# Patient Record
Sex: Female | Born: 1985 | Race: White | Hispanic: No | Marital: Married | Smoking: Never smoker
Health system: Southern US, Community
[De-identification: ages and names within clinical notes are randomized; demographics above are authoritative.]

## PROBLEM LIST (undated history)

## (undated) ENCOUNTER — Inpatient Hospital Stay: Payer: Self-pay

## (undated) DIAGNOSIS — F419 Anxiety disorder, unspecified: Secondary | ICD-10-CM

## (undated) DIAGNOSIS — K219 Gastro-esophageal reflux disease without esophagitis: Secondary | ICD-10-CM

## (undated) DIAGNOSIS — D649 Anemia, unspecified: Secondary | ICD-10-CM

## (undated) HISTORY — DX: Anxiety disorder, unspecified: F41.9

## (undated) HISTORY — DX: Anemia, unspecified: D64.9

## (undated) HISTORY — PX: WISDOM TOOTH EXTRACTION: SHX21

---
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
## 2011-05-23 ENCOUNTER — Encounter: Payer: Self-pay | Admitting: Maternal & Fetal Medicine

## 2011-06-06 ENCOUNTER — Encounter: Payer: Self-pay | Admitting: Obstetrics and Gynecology

## 2011-07-04 ENCOUNTER — Encounter: Payer: Self-pay | Admitting: Obstetrics and Gynecology

## 2011-08-16 ENCOUNTER — Encounter: Payer: Self-pay | Admitting: Pediatric Cardiology

## 2011-12-08 ENCOUNTER — Inpatient Hospital Stay: Payer: Self-pay | Admitting: Obstetrics and Gynecology

## 2011-12-08 LAB — CBC WITH DIFFERENTIAL/PLATELET
Basophil #: 0.1 10*3/uL (ref 0.0–0.1)
Basophil %: 0.5 %
Eosinophil #: 0.1 10*3/uL (ref 0.0–0.7)
HGB: 12.1 g/dL (ref 12.0–16.0)
Lymphocyte #: 2 10*3/uL (ref 1.0–3.6)
Lymphocyte %: 19.1 %
MCH: 30.4 pg (ref 26.0–34.0)
MCHC: 35 g/dL (ref 32.0–36.0)
MCV: 87 fL (ref 80–100)
Monocyte #: 0.6 x10 3/mm (ref 0.2–0.9)
Monocyte %: 6.1 %
Neutrophil #: 7.6 10*3/uL — ABNORMAL HIGH (ref 1.4–6.5)
RBC: 4 10*6/uL (ref 3.80–5.20)

## 2011-12-09 LAB — PLATELET COUNT: Platelet: 139 10*3/uL — ABNORMAL LOW (ref 150–440)

## 2011-12-10 LAB — HEMATOCRIT: HCT: 33 % — ABNORMAL LOW (ref 35.0–47.0)

## 2013-04-19 IMAGING — US US OB NUCHAL TRANSLUCENCY 1ST GEST - [facility] NRPT
1 series · 14 of 28 positions shown · non-contrast
Comparison: none

[Series 1: us ob nuchal translucency 1st gest - [facility] nrpt · 14 of 30 slices shown]
[im 2/30]
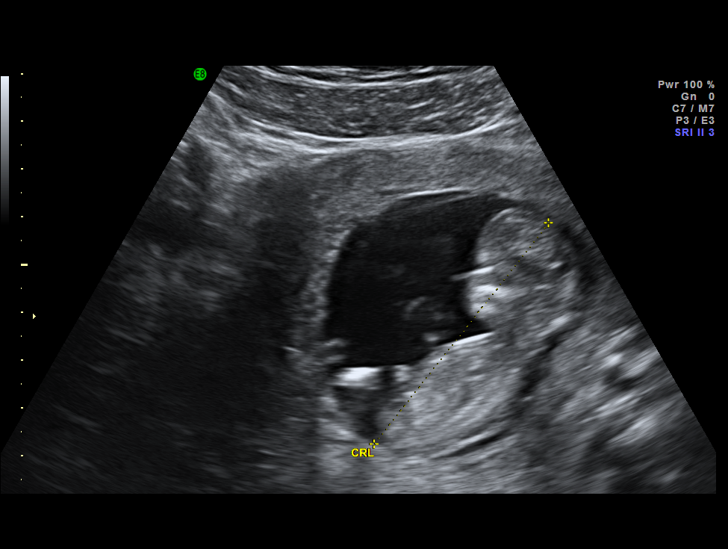
[im 4/30]
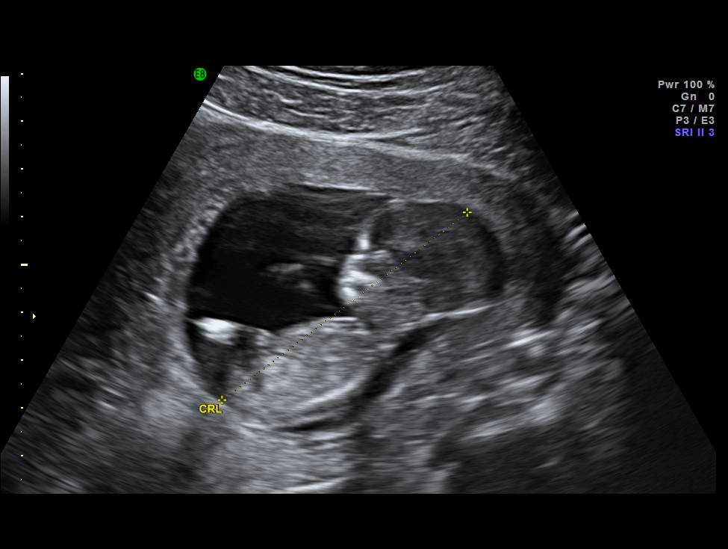
[im 6/30]
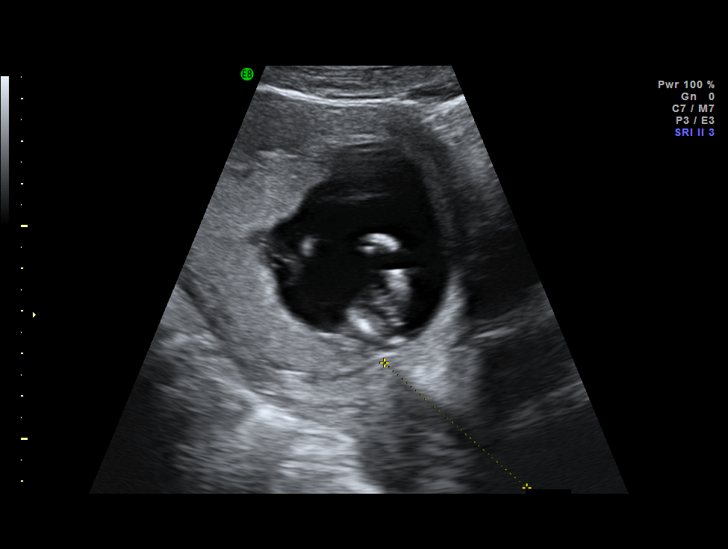
[im 8/30]
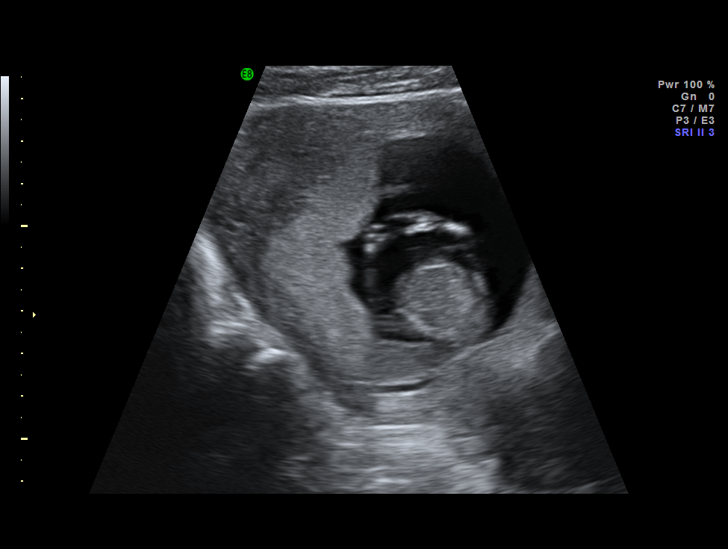
[im 10/30]
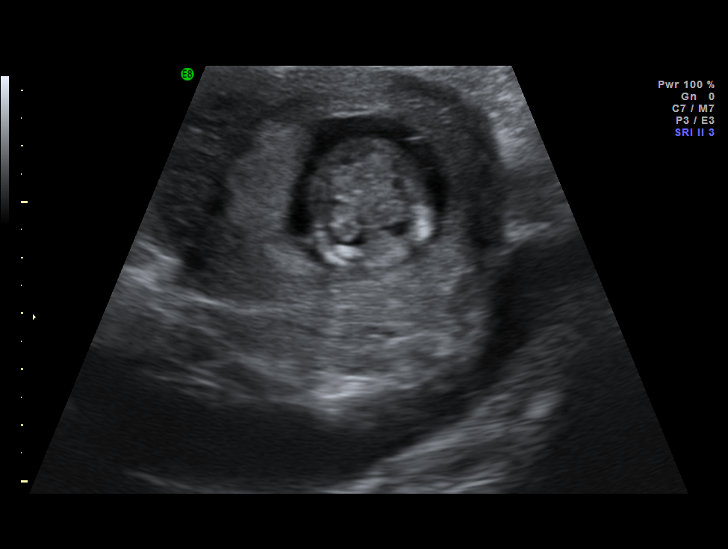
[im 12/30]
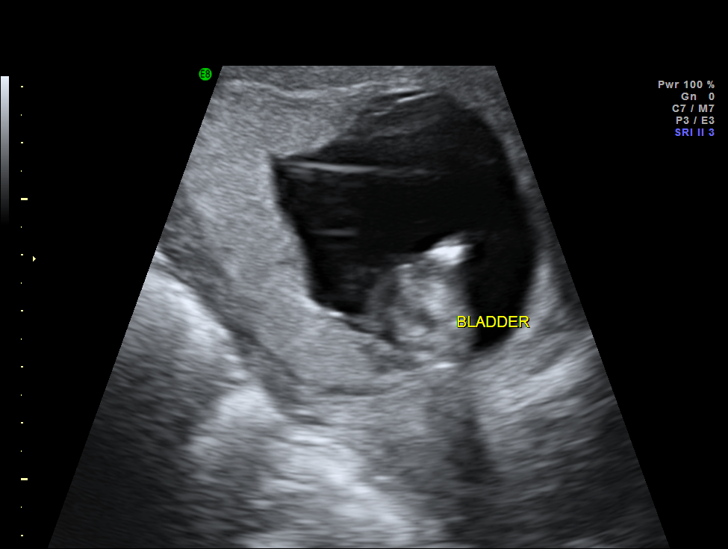
[im 14/30]
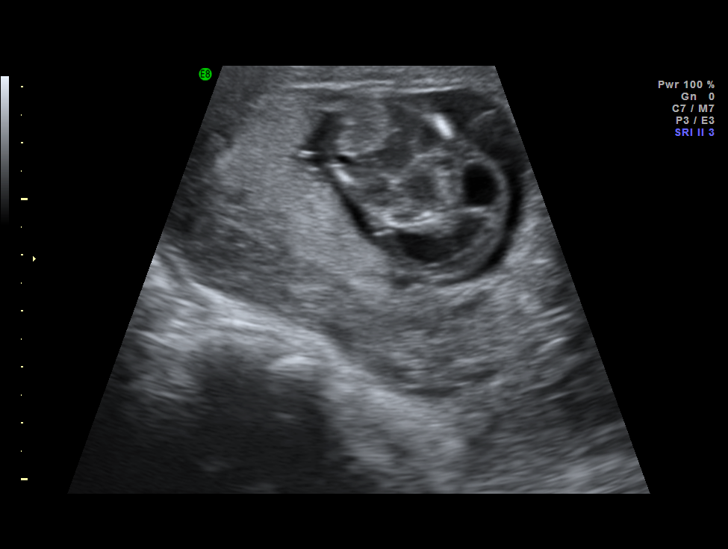
[im 17/30]
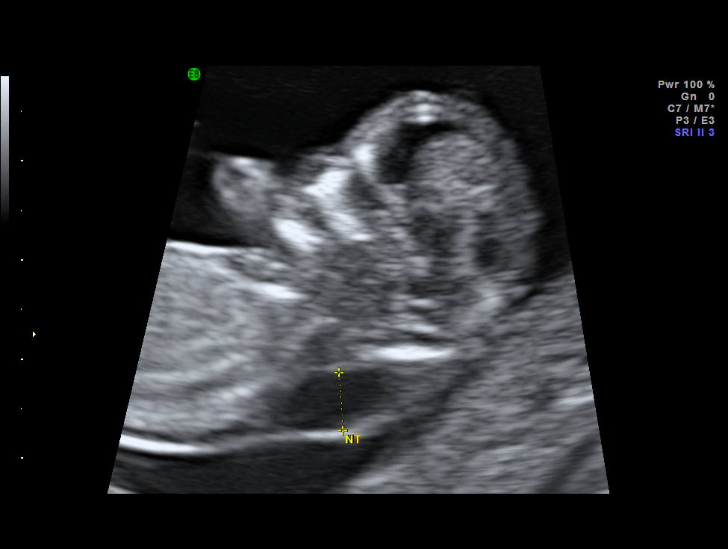
[im 19/30]
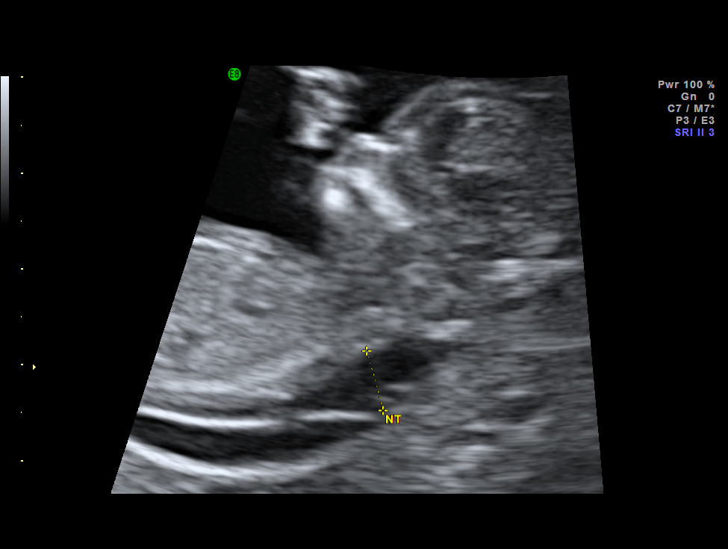
[im 21/30]
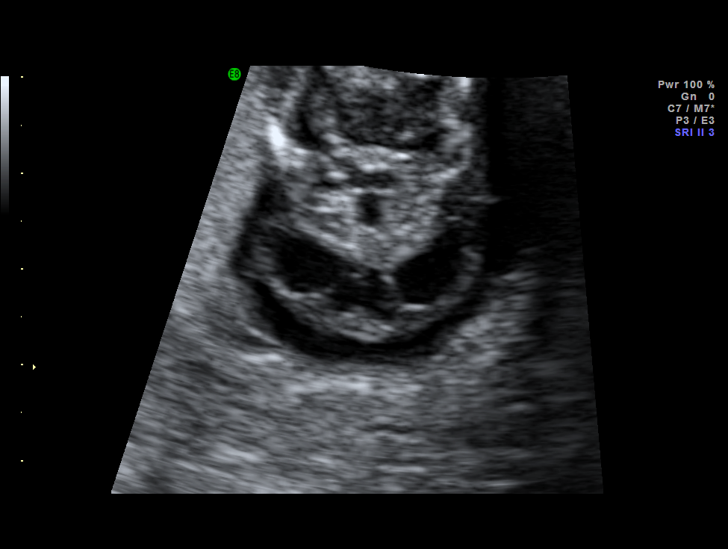
[im 23/30]
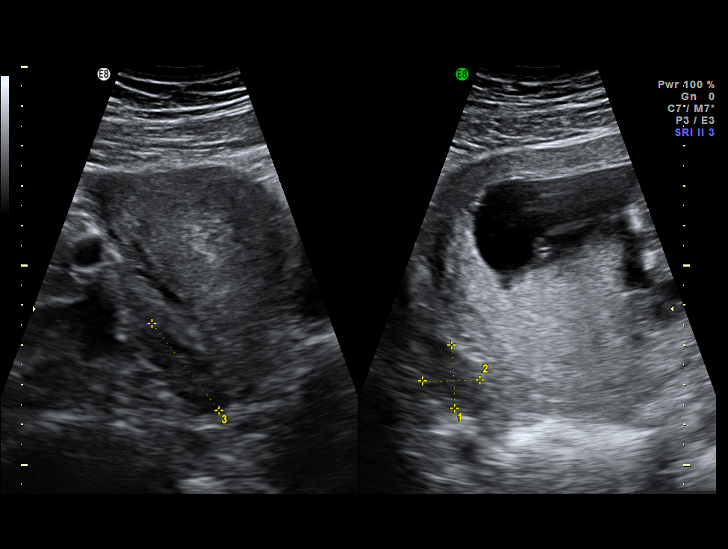
[im 25/30]
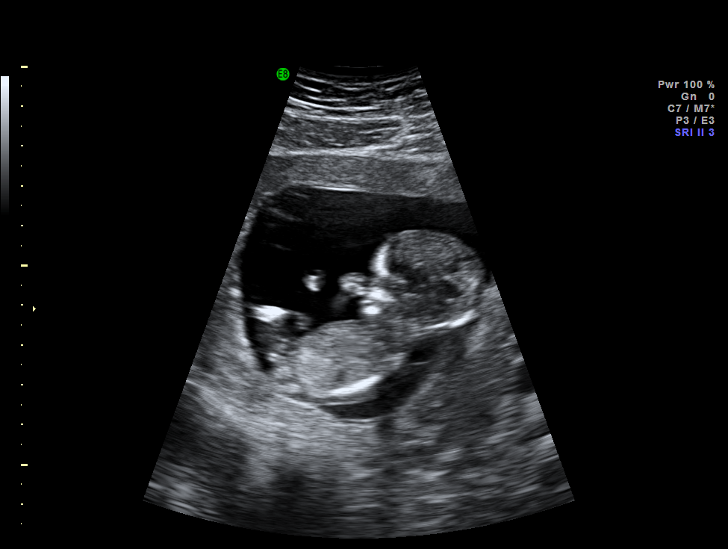
[im 27/30]
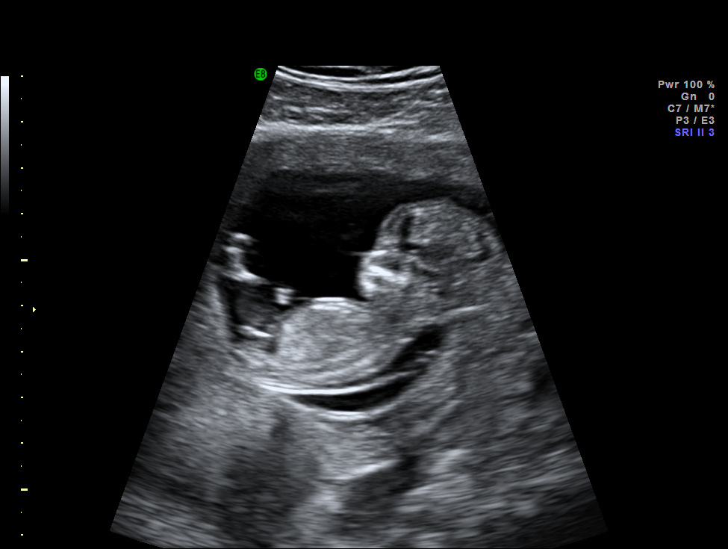
[im 30/30]
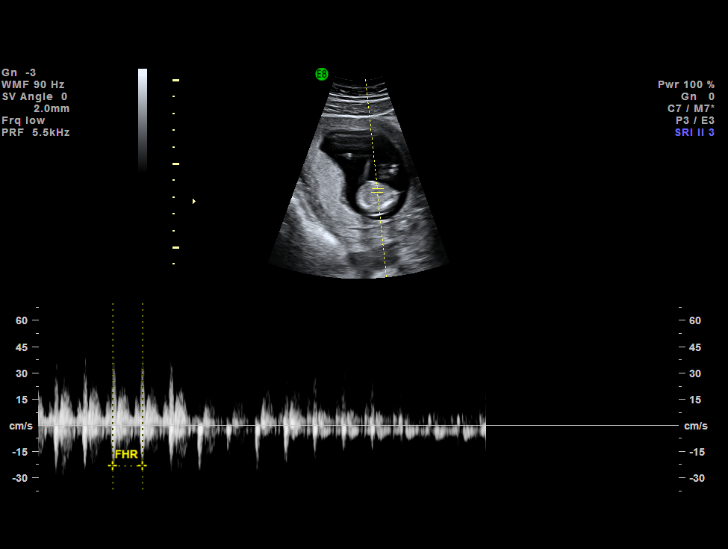

[14 of 28 positions shown; findings below may reference images not displayed]

IMAGES IMPORTED FROM THE SYNGO WORKFLOW SYSTEM
NO DICTATION FOR STUDY

## 2013-05-03 IMAGING — US ULTRAOUND OB LIMITED - NRPT [facility]
1 series · 14 of 28 positions shown · non-contrast
Comparison: none

[Series 1: ultraound ob limited - nrpt [facility] · 14 of 32 slices shown]
[im 2/32]
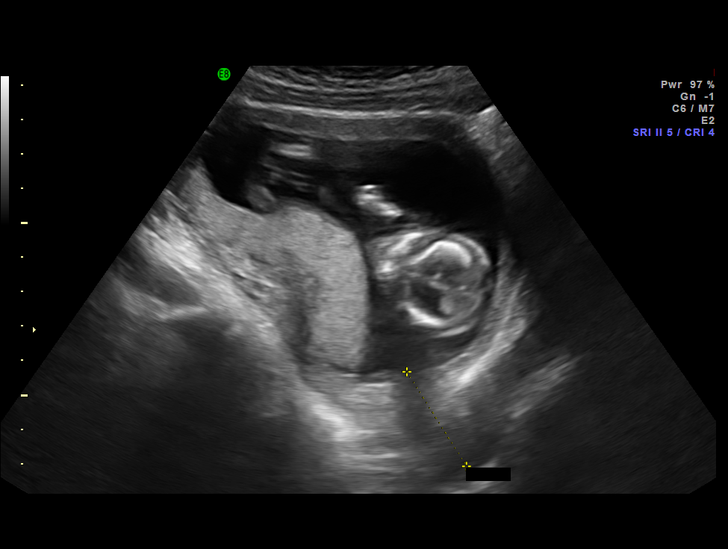
[im 4/32]
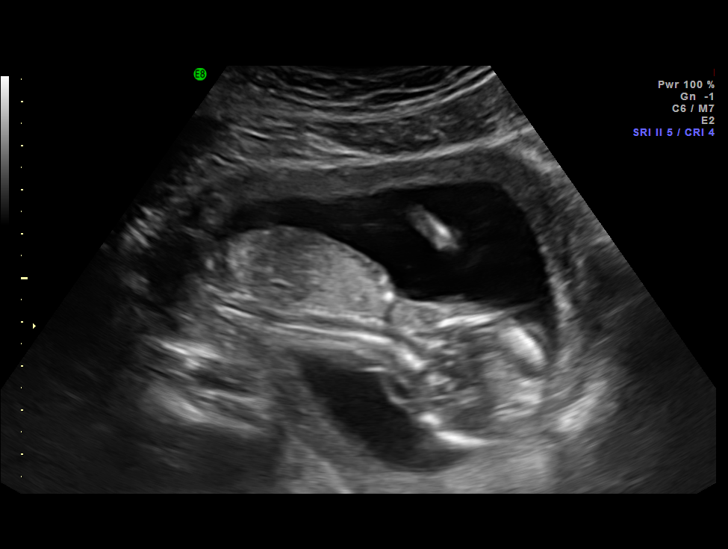
[im 6/32]
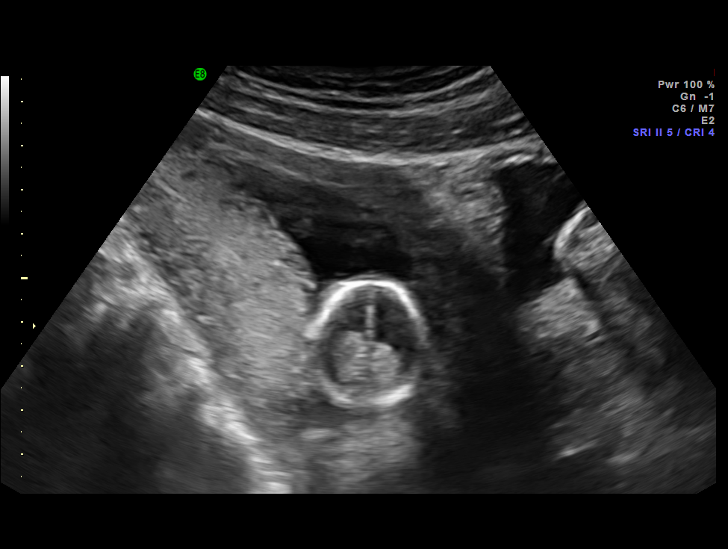
[im 9/32]
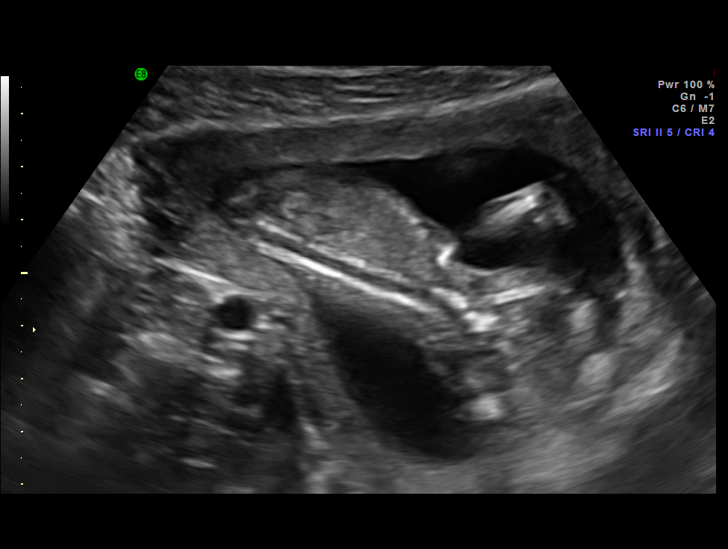
[im 11/32]
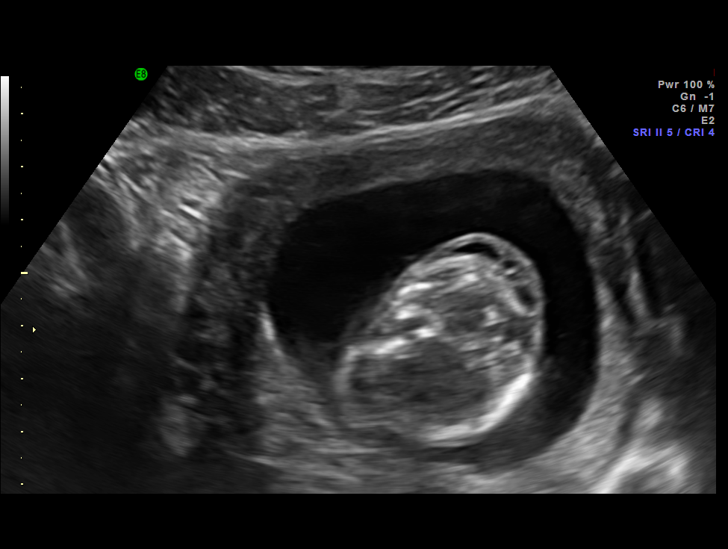
[im 13/32]
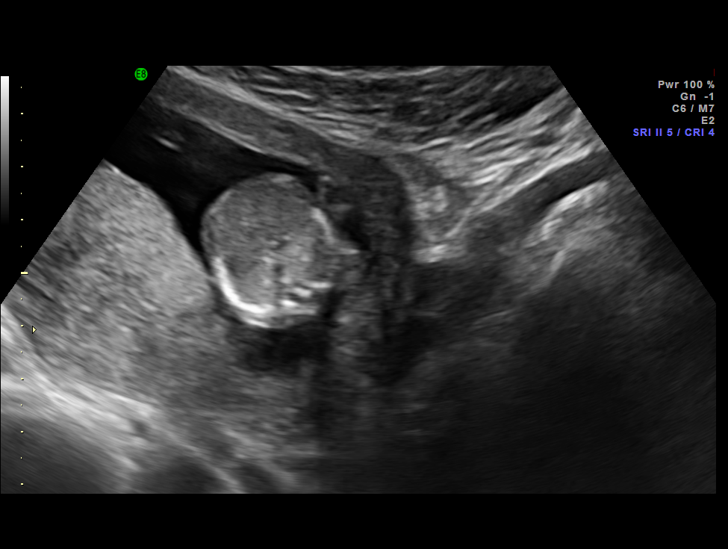
[im 15/32]
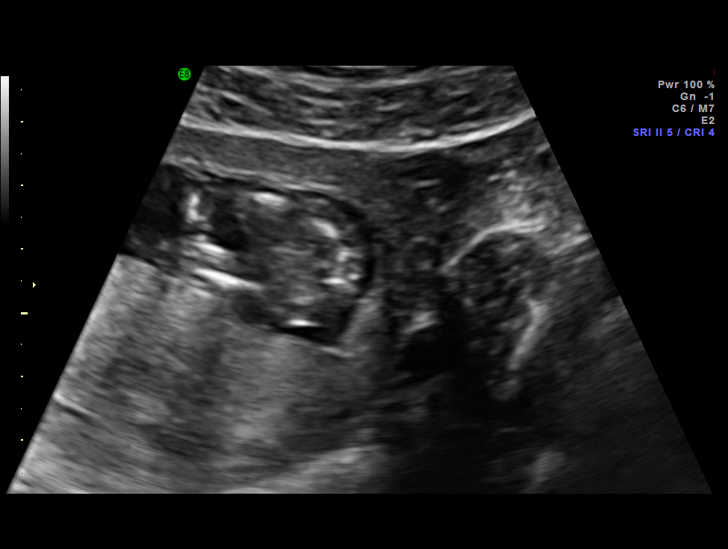
[im 18/32]
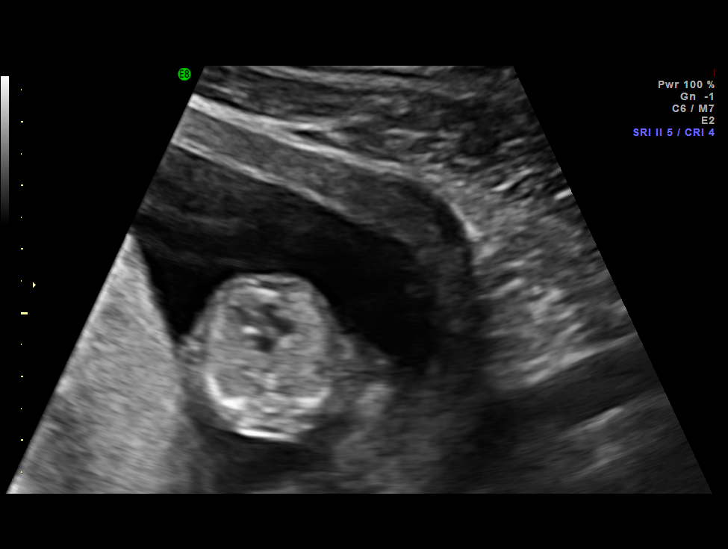
[im 20/32]
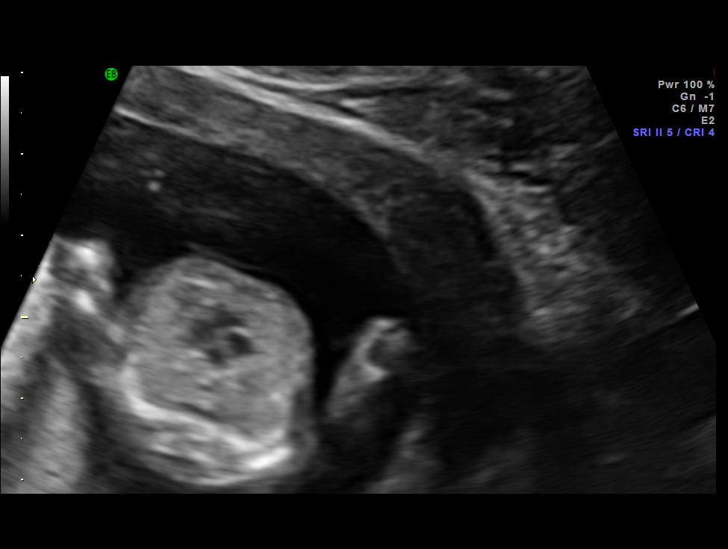
[im 22/32]
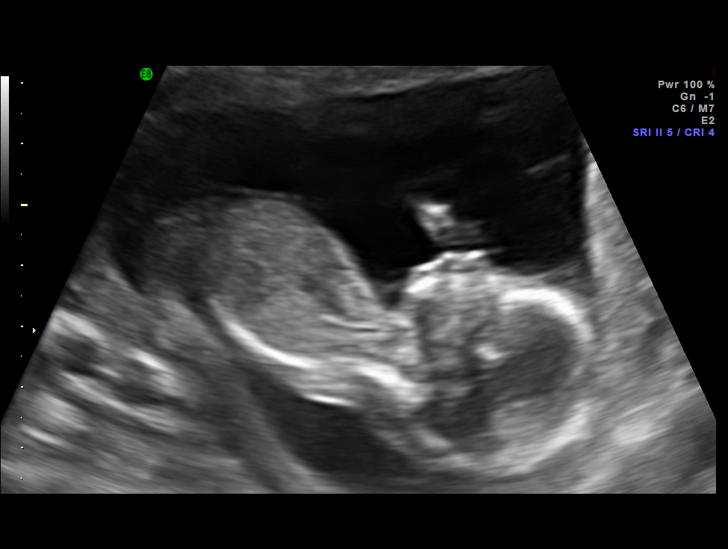
[im 25/32]
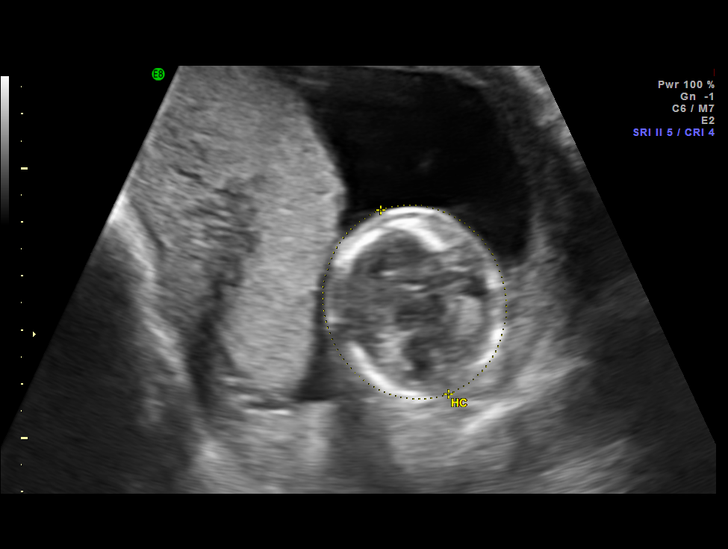
[im 27/32]
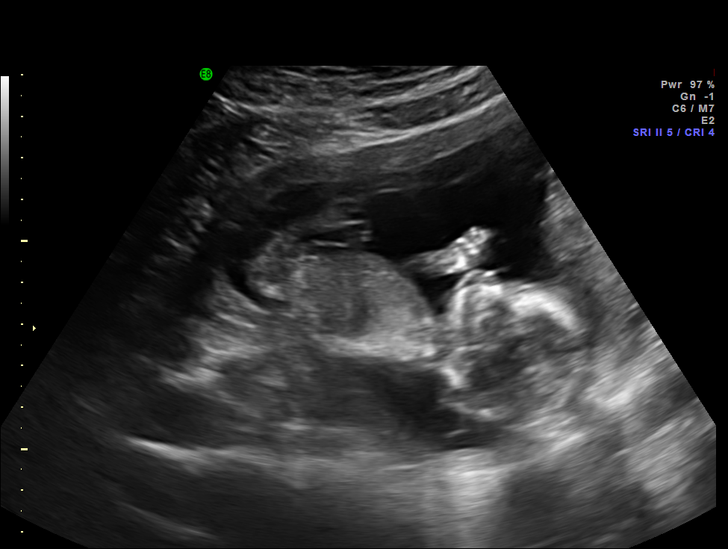
[im 29/32]
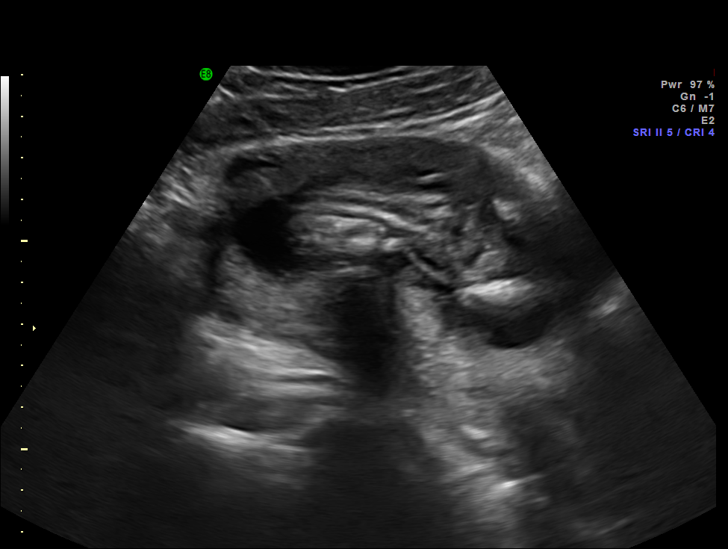
[im 32/32]
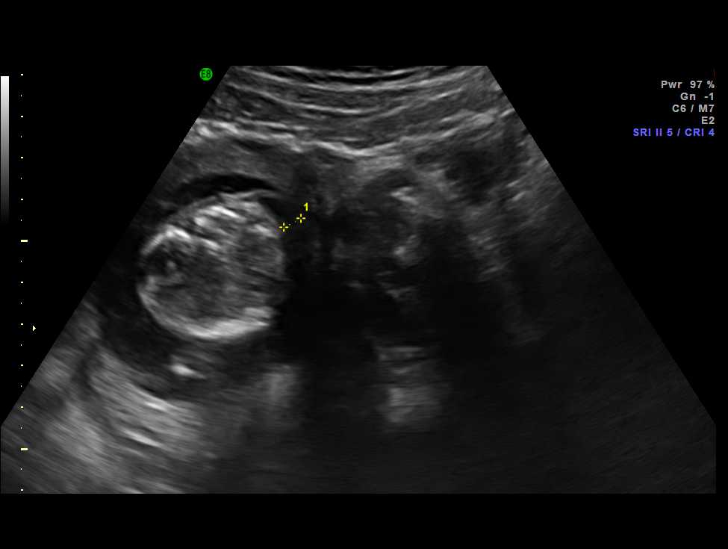

[14 of 28 positions shown; findings below may reference images not displayed]

IMAGES IMPORTED FROM THE SYNGO WORKFLOW SYSTEM
NO DICTATION FOR STUDY

## 2013-05-31 IMAGING — US US OB DETAIL+14 WK - NRPT [facility]
1 series · 14 of 28 positions shown · non-contrast
Comparison: none

[Series 1: us ob detail+14 wk - nrpt [facility] · 14 of 79 slices shown]
[im 3/79]
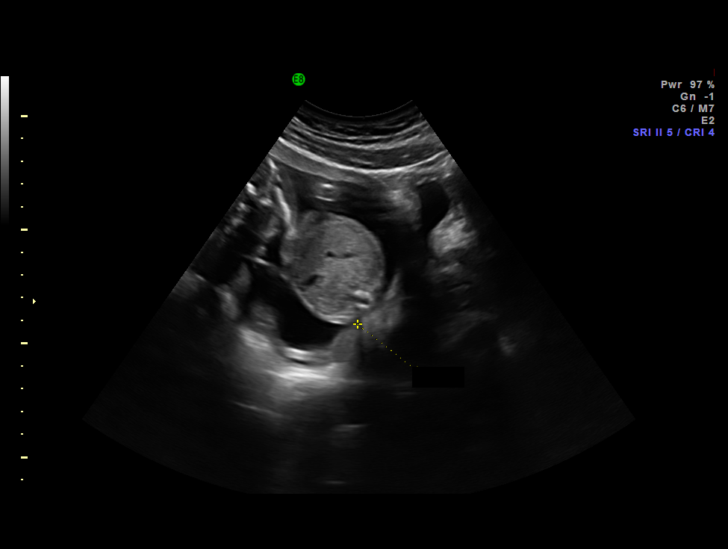
[im 9/79]
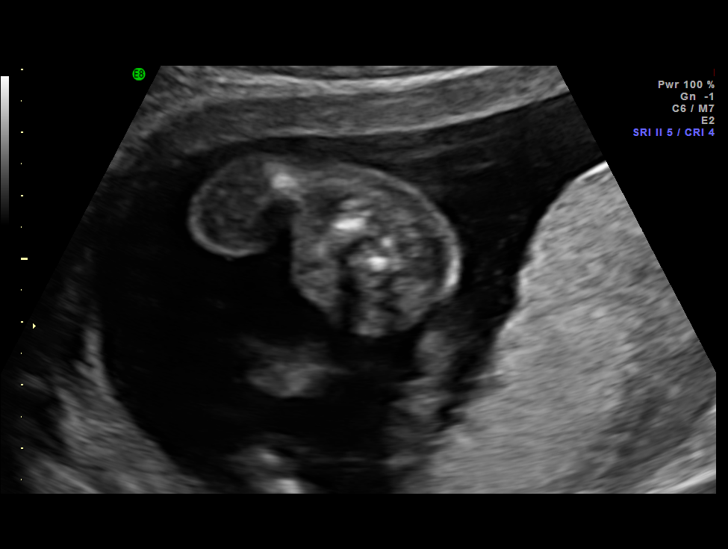
[im 15/79]
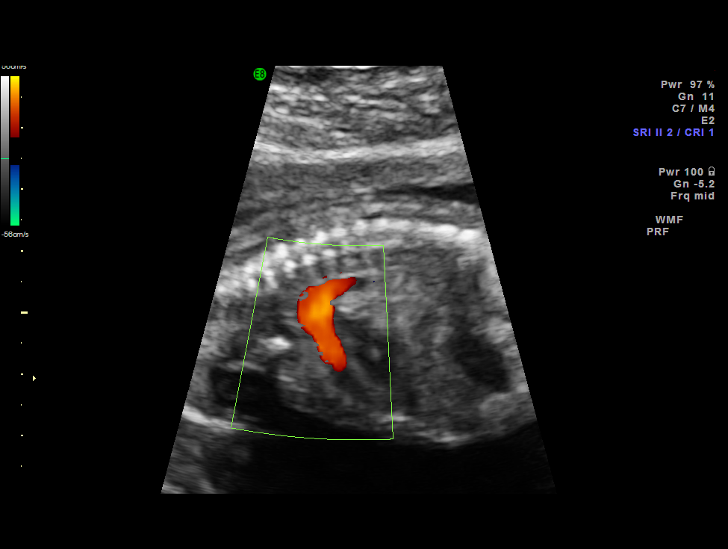
[im 21/79]
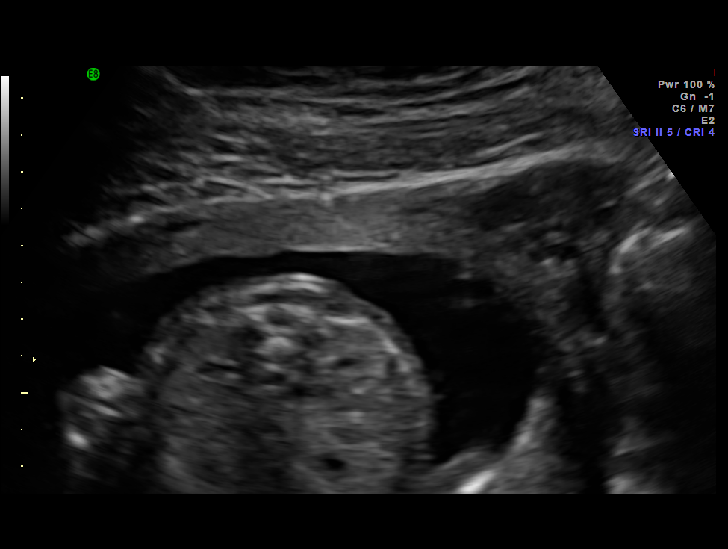
[im 27/79]
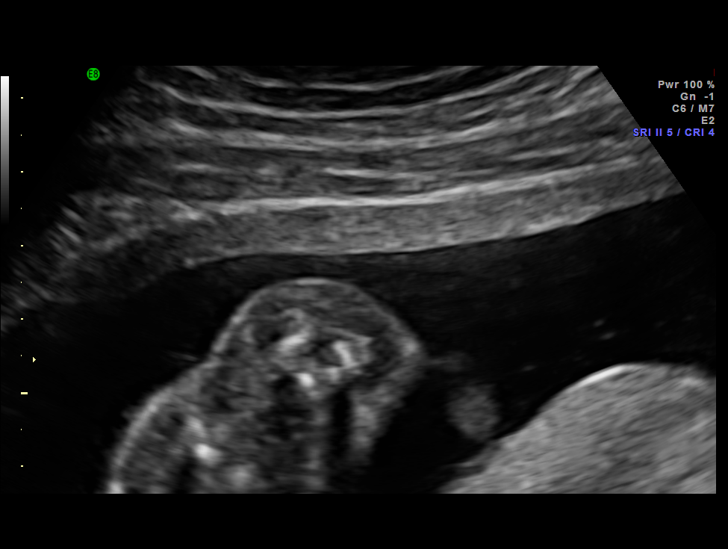
[im 32/79]
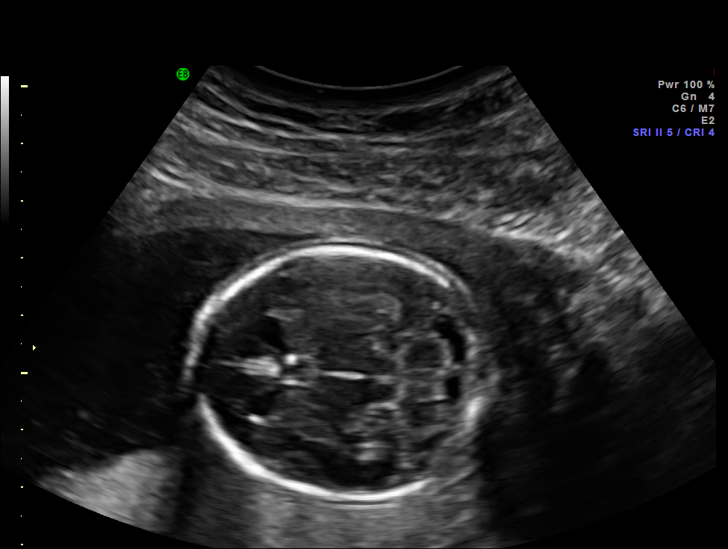
[im 38/79]
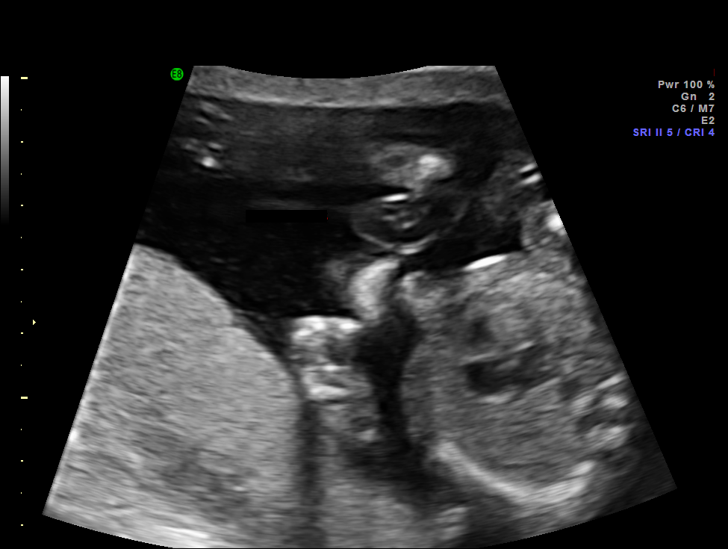
[im 44/79]
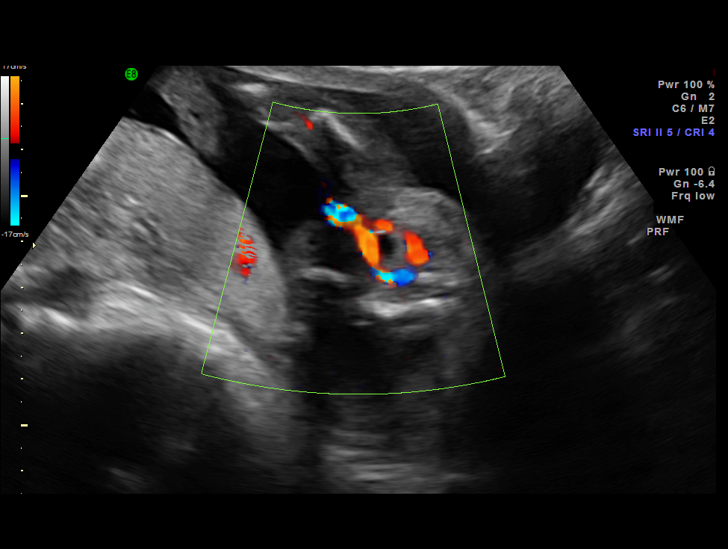
[im 50/79]
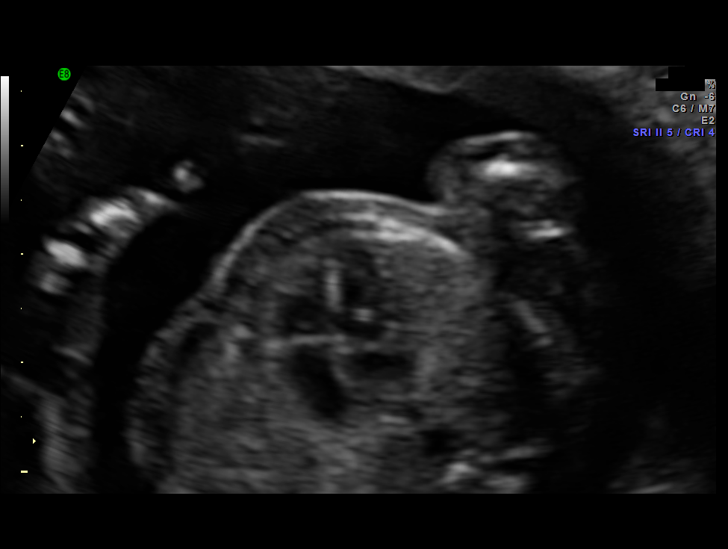
[im 55/79]
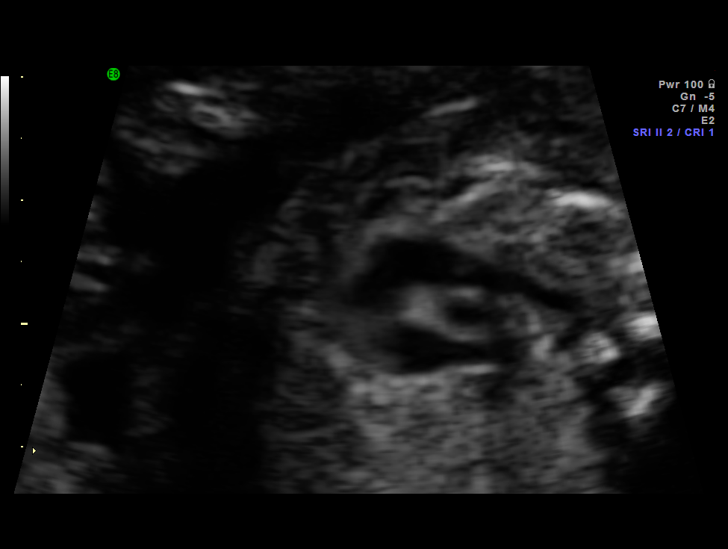
[im 61/79]
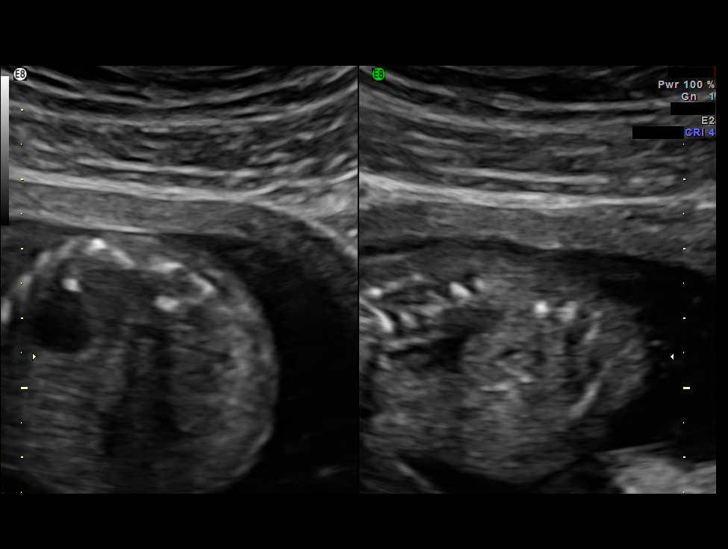
[im 67/79]
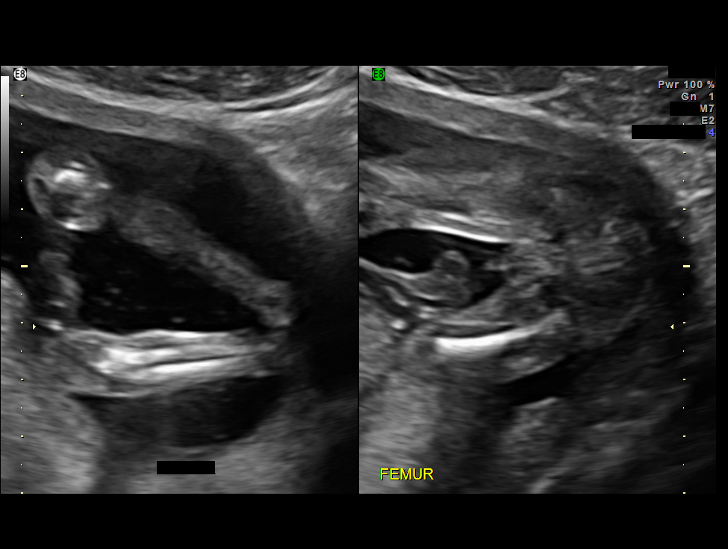
[im 73/79]
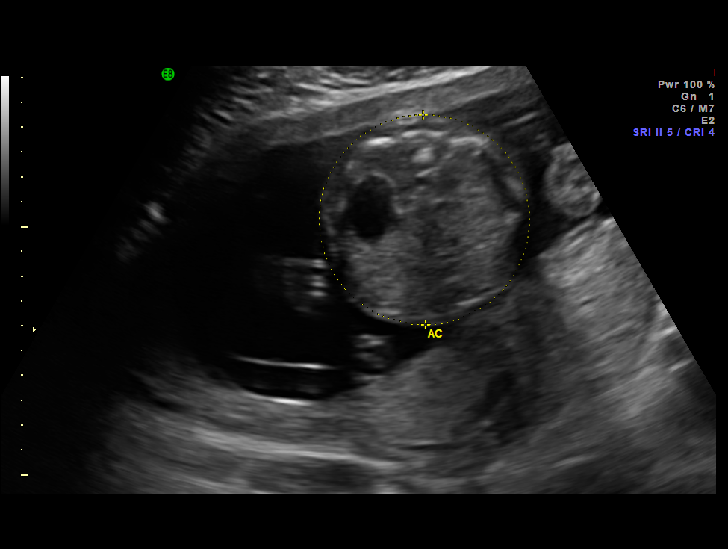
[im 79/79]
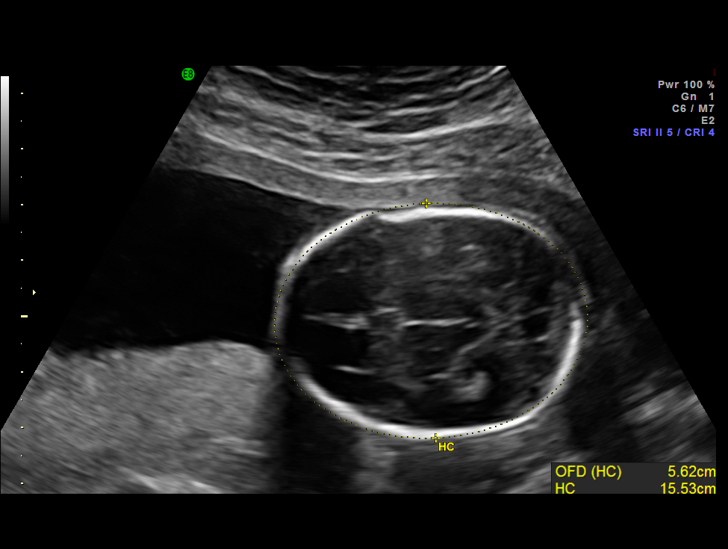

[14 of 28 positions shown; findings below may reference images not displayed]

IMAGES IMPORTED FROM THE SYNGO WORKFLOW SYSTEM
NO DICTATION FOR STUDY

## 2013-09-27 ENCOUNTER — Encounter: Payer: Self-pay | Admitting: Internal Medicine

## 2014-03-14 DIAGNOSIS — T8859XA Other complications of anesthesia, initial encounter: Secondary | ICD-10-CM

## 2014-03-14 HISTORY — DX: Other complications of anesthesia, initial encounter: T88.59XA

## 2014-07-01 NOTE — Op Note (Signed)
PATIENT NAME:  Heidi Bonilla, [name] MR#:  [id number] DATE OF BIRTH:  [birth year]  DATE OF PROCEDURE:  12/09/2011  PREOPERATIVE DIAGNOSIS:  1. Nonreassuring fetal monitoring.  2. Postdate gestation.   POSTOPERATIVE DIAGNOSIS:  1. Nonreassuring fetal monitoring. 2. Postdate gestation.  PROCEDURE:  1. Primary low transverse cesarean section.  2. On-Q pump placement.   ANESTHESIA: Surgical dosing of continuous lumbar epidural.   SURGEON: Suzy Bouchardhomas J Schermerhorn, MD   FIRST ASSISTANT: Street   INDICATIONS: This is a 29 year old gravida 1, para 0 patient brought in the night before for induction for postdates. The fetus developed intermittent bradycardic episodes when the patient was only 4 cm dilated. In utero resuscitation demonstrated good fetal monitoring and then  repeat decelerations occurred.   DESCRIPTION OF PROCEDURE: After surgical dosing of continuous lumbar epidural, the patient was placed in the dorsal supine position with a hip roll under the right side. The patient's abdomen was prepped and draped in normal sterile fashion. A Foley catheter was placed. A low transverse uterine incision was made. The fascia was scored in the midline, and the fascia was opened in a transverse fashion. The superior aspect of the fascia was grasped with Kocher clamps and the recti muscles dissected free. The inferior aspect of the fascia was grasped with Kocher clamps. The pyramidalis muscle was dissected free. Entry into the peritoneal cavity was accomplished sharply. The vesicouterine peritoneal fold was identified and opened and the bladder was reflected inferiorly. A low transverse uterine incision was made. Upon entry into the endometrial cavity, clear fluid resulted. The incision was extended with blunt transverse traction. The fetal head was brought to the incision and a vacuum applied to the occiput. With one gentle pull the fetal head was delivered. There was some caput and molding before vacuum was  placed. The patient's pelvis seemed slightly restricted, and the fetal head was wedged into the pelvis. The shoulders and body were delivered without difficulty. A vigorous female was passed to Dr. Beckie Saltsasnadi, who assigned Apgar scores of eight and nine. The placenta was manually delivered and the uterus was exteriorized. The endometrial cavity was wiped clean with a laparotomy tape. The uterine incision was closed with 1 chromic suture in a running locking fashion. Good hemostasis was noted. The fallopian tubes and ovaries appeared normal. The posterior cul-de-sac was irrigated and suctioned. The uterus was placed back into the abdominal cavity. The paracolic gutters were wiped clean with laparotomy tape, and the uterine incision again appeared hemostatic. Interceed was placed over the uterine incision in a T-shaped fashion. The superior aspect of the fascia was then grasped with Kocher clamps, and the On-Q pump catheters were tunneled from the infraumbilical area subfascially. The fascia was then closed over top of this with 0 Vicryl suture with good approximation of edges. Good hemostasis was noted. Subcutaneous tissues were irrigated and bovied, and the skin was reapproximated with Insorb absorbable stapler, and the On-Q pump catheters were secured at the skin level with Dermabond and Steri-Strips and a Tegaderm placed over this. Each catheter was loaded with 5 mL of 0.5% Marcaine. There were no complications. Estimated blood loss 500 mL. Intraoperative fluids were 1300 mL. The patient tolerated the procedure well and was taken to the recovery room in good condition.   ____________________________ Suzy Bouchardhomas J. Schermerhorn, MD tjs:cbb D: 12/09/2011 17:59:21 ET T: 12/10/2011 09:52:36 ET JOB#: [id number]  cc: Suzy Bouchardhomas J. Schermerhorn, MD, <Dictator> Suzy BouchardHOMAS J SCHERMERHORN MD ELECTRONICALLY SIGNED 12/12/2011 9:42

## 2014-07-01 NOTE — Discharge Summary (Signed)
PATIENT NAME:  [name], Heidi D MR#:  [id number] DATE OF BIRTH:  [birth year]  DATE OF ADMISSION:  12/08/2011 DATE OF DISCHARGE:  12/12/2011  HOSPITAL COURSE: The patient underwent post dates induction with which the patient had labor that progressed to 4 cm. The fetus had intolerance to labor and she underwent a primary cesarean section, uncomplicated. Postoperative day #1 hematocrit 33%. The patient did well postoperatively. She is discharged to home in good condition.   DISCHARGE MEDICATIONS:  1. Norco 1 to 2 tablets every 4 to 6 hours.  2. Motrin 800 mg every eight hours.  3. Micronor 1 tablet daily starting in three weeks. The patient is aware of taking this at the same time every day.  DISCHARGE INSTRUCTIONS AND FOLLOWUP: The patient will have pelvic rest for six weeks.  I will see her back in the office in two weeks or before if she has wound drainage, fever, nausea, or vomiting.    ____________________________ Suzy Bouchardhomas J. Boluwatife Mutchler, MD tjs:bjt D: 12/12/2011 09:28:14 ET T: 12/12/2011 10:30:33 ET JOB#: [id number]  cc: Suzy Bouchardhomas J. Koraima Albertsen, MD, <Dictator> Suzy BouchardHOMAS J Flynt Breeze MD ELECTRONICALLY SIGNED 12/13/2011 12:11

## 2014-11-24 ENCOUNTER — Observation Stay
Admission: RE | Admit: 2014-11-24 | Discharge: 2014-11-24 | Disposition: A | Discharge status: Home / Self Care | Payer: BC Managed Care – PPO | Attending: Obstetrics and Gynecology | Admitting: Obstetrics and Gynecology

## 2014-11-24 DIAGNOSIS — R102 Pelvic and perineal pain: Secondary | ICD-10-CM | POA: Insufficient documentation

## 2014-11-24 DIAGNOSIS — O26899 Other specified pregnancy related conditions, unspecified trimester: Secondary | ICD-10-CM | POA: Diagnosis not present

## 2014-11-24 LAB — URINALYSIS COMPLETE WITH MICROSCOPIC (ARMC ONLY)
BACTERIA UA: NONE SEEN
Bilirubin Urine: NEGATIVE
Glucose, UA: NEGATIVE mg/dL
HGB URINE DIPSTICK: NEGATIVE
Ketones, ur: NEGATIVE mg/dL
Nitrite: NEGATIVE
PH: 7 (ref 5.0–8.0)
PROTEIN: NEGATIVE mg/dL
RBC / HPF: NONE SEEN RBC/hpf (ref 0–5)
Specific Gravity, Urine: 1.008 (ref 1.005–1.030)

## 2014-11-24 LAB — FETAL FIBRONECTIN: FETAL FIBRONECTIN: NEGATIVE

## 2014-11-24 MED ORDER — ACETAMINOPHEN 325 MG PO TABS: 650.0000 mg | ORAL_TABLET | ORAL | Status: DC | PRN

## 2014-11-24 MED ORDER — TERBUTALINE SULFATE 1 MG/ML IJ SOLN
0.2500 mg | Freq: Once | INTRAMUSCULAR | Status: AC
  Administered 2014-11-24: 0.25 mg via SUBCUTANEOUS

## 2014-11-24 MED ORDER — TERBUTALINE SULFATE 1 MG/ML IJ SOLN
INTRAMUSCULAR | Status: AC
  Administered 2014-11-24: 0.25 mg via SUBCUTANEOUS
  Filled 2014-11-24: qty 1

## 2014-11-24 NOTE — Discharge Instructions (Signed)
Call provider or return to birthplace with: ° °1. Regular contractions °2. Leaking of fluid from your vaginal °3. Vaginal bleeding: Bright red or heavy like a period °4. Decreased Fetal movement ° °

## 2014-11-24 NOTE — Progress Notes (Signed)
Patient ID: Heidi Bonilla, female   DOB: Apr 04, 1985, 29 y.o.   MRN: [id number] Heidi Bonilla [birth year] G2 P1 Unknown presents for possible LOF and pelvic pressure  No vag bleeding . No prior h/o of PTL with last  O;BP 118/63 mmHg  Pulse 80  Ht  (1.676 m)  Wt 78.472 kg (173 lb)  BMI 27.94 kg/m2  SpO2 98% ABDsoft NT  CX closed by RN , Negative nitrazine . Negative FFN NST reassuring , + uterine irritability  Labs: ua  + leukocytes  A: no evidence of ROM . Uterine irritability with out cx dilation  P:sq terb X 1 Urine culture  Precautions given  RTC if 6 or greater ctx / hr

## 2014-11-26 LAB — URINE CULTURE

## 2014-12-03 DIAGNOSIS — B373 Candidiasis of vulva and vagina: Secondary | ICD-10-CM | POA: Insufficient documentation

## 2014-12-03 DIAGNOSIS — O2213 Genital varices in pregnancy, third trimester: Secondary | ICD-10-CM | POA: Insufficient documentation

## 2014-12-03 DIAGNOSIS — O23593 Infection of other part of genital tract in pregnancy, third trimester: Secondary | ICD-10-CM | POA: Insufficient documentation

## 2014-12-03 DIAGNOSIS — N760 Acute vaginitis: Secondary | ICD-10-CM | POA: Insufficient documentation

## 2014-12-03 DIAGNOSIS — B3731 Acute candidiasis of vulva and vagina: Secondary | ICD-10-CM | POA: Insufficient documentation

## 2015-01-28 NOTE — H&P (Signed)
Heidi Bonilla is a 29 y.o. female presenting for elective repeat c/s . EDC 02/13/15. History OB History    Gravida Para Term Preterm AB TAB SAB Ectopic Multiple Living   2 1        1      No past medical history on file.none  No past surgical history on file.previous c/s 11/2011 Family History: family history is not on file. Social History:  has no tobacco, alcohol, and drug history on file.   Prenatal Transfer Tool  Maternal Diabetes: No Genetic Screening: Declined Maternal Ultrasounds/Referrals: Normal Fetal Ultrasounds or other Referrals:  None Maternal Substance Abuse:  No Significant Maternal Medications:  None Significant Maternal Lab Results:  None Other Comments:  None  ROS    There were no vitals taken for this visit. Exam Physical Exam  Prenatal labs: ABO, Rh:  B+ Antibody:  neg Rubella:  IMMune , Varicella : immune  RPR:   neg  HBsAg:   neg  HIV:   neg GBS:   neg  Assessment/Plan Pt elects for repeat LTCS . Declines BTL  Surgery scheduled on 02/09/16( 39+3 weeks )   Jackey Housey 01/28/2015, 11:59 AM

## 2015-02-06 ENCOUNTER — Encounter
Admission: RE | Admit: 2015-02-06 | Discharge: 2015-02-06 | Disposition: A | Discharge status: Home / Self Care | Payer: BC Managed Care – PPO | Source: Ambulatory Visit | Attending: Obstetrics and Gynecology | Admitting: Obstetrics and Gynecology

## 2015-02-06 LAB — CBC
HEMATOCRIT: 33.5 % — AB (ref 35.0–47.0)
HEMOGLOBIN: 11.6 g/dL — AB (ref 12.0–16.0)
MCH: 29.7 pg (ref 26.0–34.0)
MCHC: 34.6 g/dL (ref 32.0–36.0)
MCV: 85.9 fL (ref 80.0–100.0)
Platelets: 123 10*3/uL — ABNORMAL LOW (ref 150–440)
RBC: 3.89 MIL/uL (ref 3.80–5.20)
RDW: 12.6 % (ref 11.5–14.5)
WBC: 6.1 10*3/uL (ref 3.6–11.0)

## 2015-02-06 LAB — TYPE AND SCREEN
ABO/RH(D): B POS
ANTIBODY SCREEN: NEGATIVE

## 2015-02-06 LAB — ABO/RH: ABO/RH(D): B POS

## 2015-02-06 NOTE — Patient Instructions (Signed)
  Your procedure is scheduled on: 02/09/15 Report to Day Surgery/ Labor and Delivery .  Remember: Instructions that are not followed completely may result in serious medical risk, up to and including death, or upon the discretion of your surgeon and anesthesiologist your surgery may need to be rescheduled.    __x__ 1. Do not eat food or drink liquids after midnight. No gum chewing or hard candies.     __x_ 2. No Alcohol for 24 hours before or after surgery.   ____ 3. Bring all medications with you on the day of surgery if instructed.    __x__ 4. Notify your doctor if there is any change in your medical condition     (cold, fever, infections).     Do not wear jewelry, make-up, hairpins, clips or nail polish.  Do not wear lotions, powders, or perfumes. You may wear deodorant.  Do not shave 48 hours prior to surgery. Men may shave face and neck.  Do not bring valuables to the hospital.    [facility] not [facility] for any belongings or valuables.               Contacts, dentures or bridgework may not be worn into surgery.  Leave your suitcase in the car. After surgery it may be brought to your room.  For patients admitted to the hospital, discharge time is determined by your                treatment team.   Patients discharged the day of surgery will not be allowed to drive home.   Please read over the following fact sheets that you were given:   Surgical Site Infection Prevention   ____ Take these medicines the morning of surgery with A SIP OF WATER:    1.   2.   3.   4.  5.  6.  ____ Fleet Enema (as directed)   __x__ Use CHG Soap as directed  ____ Use inhalers on the day of surgery  ____ Stop metformin 2 days prior to surgery    ____ Take 1/2 of usual insulin dose the night before surgery and none on the morning of surgery.   ____ Stop Coumadin/Plavix/aspirin on   ____ Stop Anti-inflammatories on tylenol only   __x__ Stop supplements until after surgery.  Vit.  C today  ____ Bring C-Pap to the hospital.

## 2015-02-07 LAB — RPR: RPR: NONREACTIVE

## 2015-02-09 ENCOUNTER — Inpatient Hospital Stay
Admission: AD | Admit: 2015-02-09 | Discharge: 2015-02-12 | DRG: 766 | Disposition: A | Discharge status: Home / Self Care | Payer: BC Managed Care – PPO | Source: Ambulatory Visit | Attending: Obstetrics and Gynecology | Admitting: Obstetrics and Gynecology

## 2015-02-09 ENCOUNTER — Inpatient Hospital Stay: Payer: BC Managed Care – PPO | Admitting: Anesthesiology

## 2015-02-09 ENCOUNTER — Encounter
Admission: AD | Disposition: A | Discharge status: Home / Self Care | Payer: Self-pay | Source: Ambulatory Visit | Attending: Obstetrics and Gynecology

## 2015-02-09 DIAGNOSIS — Z3A39 39 weeks gestation of pregnancy: Secondary | ICD-10-CM

## 2015-02-09 DIAGNOSIS — Z9889 Other specified postprocedural states: Secondary | ICD-10-CM

## 2015-02-09 DIAGNOSIS — Z98891 History of uterine scar from previous surgery: Secondary | ICD-10-CM

## 2015-02-09 LAB — CBC
HEMATOCRIT: 32 % — AB (ref 35.0–47.0)
HEMOGLOBIN: 11.2 g/dL — AB (ref 12.0–16.0)
MCH: 30.1 pg (ref 26.0–34.0)
MCHC: 35 g/dL (ref 32.0–36.0)
MCV: 86 fL (ref 80.0–100.0)
Platelets: 127 10*3/uL — ABNORMAL LOW (ref 150–440)
RBC: 3.71 MIL/uL — AB (ref 3.80–5.20)
RDW: 12.8 % (ref 11.5–14.5)
WBC: 6.9 10*3/uL (ref 3.6–11.0)

## 2015-02-09 SURGERY — Surgical Case
Anesthesia: Spinal | Wound class: Clean Contaminated

## 2015-02-09 MED ORDER — LANOLIN HYDROUS EX OINT: 1.0000 "application " | TOPICAL_OINTMENT | CUTANEOUS | Status: DC | PRN

## 2015-02-09 MED ORDER — WITCH HAZEL-GLYCERIN EX PADS: 1.0000 "application " | MEDICATED_PAD | CUTANEOUS | Status: DC | PRN

## 2015-02-09 MED ORDER — MORPHINE SULFATE (PF) 0.5 MG/ML IJ SOLN
INTRAMUSCULAR | Status: DC | PRN
  Administered 2015-02-09: 100 ug via EPIDURAL

## 2015-02-09 MED ORDER — NALBUPHINE HCL 10 MG/ML IJ SOLN
5.0000 mg | INTRAMUSCULAR | Status: DC | PRN
Start: 2015-02-09 — End: 2015-02-09
  Filled 2015-02-09: qty 0.5

## 2015-02-09 MED ORDER — SODIUM CHLORIDE 0.9 % IJ SOLN: 3.0000 mL | INTRAMUSCULAR | Status: DC | PRN

## 2015-02-09 MED ORDER — ONDANSETRON HCL 4 MG/2ML IJ SOLN
4.0000 mg | Freq: Three times a day (TID) | INTRAMUSCULAR | Status: DC | PRN
  Administered 2015-02-09: 4 mg via INTRAVENOUS

## 2015-02-09 MED ORDER — OXYCODONE-ACETAMINOPHEN 5-325 MG PO TABS: 2.0000 | ORAL_TABLET | ORAL | Status: DC | PRN

## 2015-02-09 MED ORDER — ACETAMINOPHEN 500 MG PO TABS
1000.0000 mg | ORAL_TABLET | Freq: Four times a day (QID) | ORAL | Status: DC
Start: 2015-02-09 — End: 2015-02-10

## 2015-02-09 MED ORDER — ONDANSETRON HCL 4 MG/2ML IJ SOLN
4.0000 mg | Freq: Once | INTRAMUSCULAR | Status: AC | PRN
  Administered 2015-02-09: 4 mg via INTRAVENOUS

## 2015-02-09 MED ORDER — ACETAMINOPHEN 325 MG PO TABS: 650.0000 mg | ORAL_TABLET | ORAL | Status: DC | PRN

## 2015-02-09 MED ORDER — BUPIVACAINE 0.5 % ON-Q PUMP DUAL CATH 300 ML
INJECTION | Status: DC | PRN
  Administered 2015-02-09: 10 mL

## 2015-02-09 MED ORDER — DIPHENHYDRAMINE HCL 25 MG PO CAPS: 25.0000 mg | ORAL_CAPSULE | ORAL | Status: DC | PRN

## 2015-02-09 MED ORDER — SIMETHICONE 80 MG PO CHEW: 80.0000 mg | CHEWABLE_TABLET | ORAL | Status: DC | PRN

## 2015-02-09 MED ORDER — BUPIVACAINE 0.25 % ON-Q PUMP DUAL CATH 400 ML
400.0000 mL | INJECTION | Status: DC
  Filled 2015-02-09: qty 400

## 2015-02-09 MED ORDER — NALOXONE HCL 2 MG/2ML IJ SOSY
1.0000 ug/kg/h | PREFILLED_SYRINGE | INTRAVENOUS | Status: DC | PRN
  Filled 2015-02-09: qty 2

## 2015-02-09 MED ORDER — EPHEDRINE SULFATE 50 MG/ML IJ SOLN
INTRAMUSCULAR | Status: DC | PRN
  Administered 2015-02-09: 5 mg via INTRAVENOUS

## 2015-02-09 MED ORDER — LACTATED RINGERS IV SOLN
INTRAVENOUS | Status: DC
  Administered 2015-02-09 – 2015-02-10 (×3): via INTRAVENOUS

## 2015-02-09 MED ORDER — MENTHOL 3 MG MT LOZG
1.0000 | LOZENGE | OROMUCOSAL | Status: DC | PRN
  Filled 2015-02-09: qty 9

## 2015-02-09 MED ORDER — OXYTOCIN 40 UNITS IN LACTATED RINGERS INFUSION - SIMPLE MED
62.5000 mL/h | INTRAVENOUS | Status: DC
  Filled 2015-02-09: qty 1000

## 2015-02-09 MED ORDER — DIBUCAINE 1 % RE OINT: 1.0000 "application " | TOPICAL_OINTMENT | RECTAL | Status: DC | PRN

## 2015-02-09 MED ORDER — ONDANSETRON HCL 4 MG/2ML IJ SOLN
4.0000 mg | Freq: Four times a day (QID) | INTRAMUSCULAR | Status: DC | PRN
  Filled 2015-02-09: qty 2

## 2015-02-09 MED ORDER — SODIUM CHLORIDE 0.9 % IV SOLN
10000.0000 ug | INTRAVENOUS | Status: DC | PRN
  Administered 2015-02-09: 10 ug via INTRAVENOUS

## 2015-02-09 MED ORDER — OXYTOCIN BOLUS FROM INFUSION
500.0000 mL | INTRAVENOUS | Status: DC
  Administered 2015-02-09: 250 mL via INTRAVENOUS

## 2015-02-09 MED ORDER — DIPHENHYDRAMINE HCL 50 MG/ML IJ SOLN: 12.5000 mg | INTRAMUSCULAR | Status: DC | PRN

## 2015-02-09 MED ORDER — HYDROMORPHONE HCL 1 MG/ML IJ SOLN: 0.2500 mg | INTRAMUSCULAR | Status: DC | PRN

## 2015-02-09 MED ORDER — DEXAMETHASONE SODIUM PHOSPHATE 10 MG/ML IJ SOLN
INTRAMUSCULAR | Status: DC | PRN
  Administered 2015-02-09: 5 mg via INTRAVENOUS

## 2015-02-09 MED ORDER — LACTATED RINGERS IV SOLN: 500.0000 mL | INTRAVENOUS | Status: DC | PRN

## 2015-02-09 MED ORDER — ONDANSETRON HCL 4 MG/2ML IJ SOLN: 4.0000 mg | Freq: Three times a day (TID) | INTRAMUSCULAR | Status: DC | PRN

## 2015-02-09 MED ORDER — NALBUPHINE HCL 10 MG/ML IJ SOLN
5.0000 mg | Freq: Once | INTRAMUSCULAR | Status: DC | PRN
  Filled 2015-02-09: qty 0.5

## 2015-02-09 MED ORDER — CITRIC ACID-SODIUM CITRATE 334-500 MG/5ML PO SOLN: 30.0000 mL | ORAL | Status: DC | PRN

## 2015-02-09 MED ORDER — ZOLPIDEM TARTRATE 5 MG PO TABS: 5.0000 mg | ORAL_TABLET | Freq: Every evening | ORAL | Status: DC | PRN

## 2015-02-09 MED ORDER — DIPHENHYDRAMINE HCL 25 MG PO CAPS: 25.0000 mg | ORAL_CAPSULE | Freq: Four times a day (QID) | ORAL | Status: DC | PRN

## 2015-02-09 MED ORDER — KETOROLAC TROMETHAMINE 30 MG/ML IJ SOLN
30.0000 mg | Freq: Four times a day (QID) | INTRAMUSCULAR | Status: DC | PRN
  Administered 2015-02-10: 30 mg via INTRAVENOUS
  Filled 2015-02-09: qty 1

## 2015-02-09 MED ORDER — BUPIVACAINE HCL (PF) 0.5 % IJ SOLN
INTRAMUSCULAR | Status: AC
  Filled 2015-02-09: qty 30

## 2015-02-09 MED ORDER — MEPERIDINE HCL 25 MG/ML IJ SOLN: 6.2500 mg | INTRAMUSCULAR | Status: DC | PRN

## 2015-02-09 MED ORDER — CITRIC ACID-SODIUM CITRATE 334-500 MG/5ML PO SOLN
ORAL | Status: AC
  Administered 2015-02-09: 30 mL
  Filled 2015-02-09: qty 15

## 2015-02-09 MED ORDER — IBUPROFEN 600 MG PO TABS: 600.0000 mg | ORAL_TABLET | Freq: Four times a day (QID) | ORAL | Status: DC

## 2015-02-09 MED ORDER — SIMETHICONE 80 MG PO CHEW
80.0000 mg | CHEWABLE_TABLET | ORAL | Status: DC
  Administered 2015-02-11: 80 mg via ORAL
  Filled 2015-02-09: qty 1

## 2015-02-09 MED ORDER — SIMETHICONE 80 MG PO CHEW
80.0000 mg | CHEWABLE_TABLET | Freq: Three times a day (TID) | ORAL | Status: DC
  Administered 2015-02-10 – 2015-02-12 (×7): 80 mg via ORAL
  Filled 2015-02-09 (×7): qty 1

## 2015-02-09 MED ORDER — NALOXONE HCL 0.4 MG/ML IJ SOLN: 0.4000 mg | INTRAMUSCULAR | Status: DC | PRN

## 2015-02-09 MED ORDER — PRENATAL MULTIVITAMIN CH
1.0000 | ORAL_TABLET | Freq: Every day | ORAL | Status: DC
  Administered 2015-02-10 – 2015-02-11 (×2): 1 via ORAL
  Filled 2015-02-09 (×2): qty 1

## 2015-02-09 MED ORDER — TETANUS-DIPHTH-ACELL PERTUSSIS 5-2.5-18.5 LF-MCG/0.5 IM SUSP: 0.5000 mL | Freq: Once | INTRAMUSCULAR | Status: DC

## 2015-02-09 MED ORDER — SCOPOLAMINE 1 MG/3DAYS TD PT72
1.0000 | MEDICATED_PATCH | Freq: Once | TRANSDERMAL | Status: AC
  Administered 2015-02-09: 1.5 mg via TRANSDERMAL
  Filled 2015-02-09: qty 1

## 2015-02-09 MED ORDER — SENNOSIDES-DOCUSATE SODIUM 8.6-50 MG PO TABS
2.0000 | ORAL_TABLET | ORAL | Status: DC
  Administered 2015-02-11 (×2): 2 via ORAL
  Filled 2015-02-09 (×2): qty 2

## 2015-02-09 MED ORDER — CEFAZOLIN SODIUM-DEXTROSE 2-3 GM-% IV SOLR
2.0000 g | Freq: Once | INTRAVENOUS | Status: AC
  Administered 2015-02-09: 2 g via INTRAVENOUS

## 2015-02-09 MED ORDER — NALBUPHINE HCL 10 MG/ML IJ SOLN
5.0000 mg | INTRAMUSCULAR | Status: DC | PRN
  Filled 2015-02-09: qty 0.5

## 2015-02-09 MED ORDER — LIDOCAINE HCL (PF) 1 % IJ SOLN: 30.0000 mL | INTRAMUSCULAR | Status: DC | PRN

## 2015-02-09 MED ORDER — NALBUPHINE HCL 10 MG/ML IJ SOLN: 5.0000 mg | Freq: Once | INTRAMUSCULAR | Status: DC | PRN

## 2015-02-09 MED ORDER — FENTANYL CITRATE (PF) 100 MCG/2ML IJ SOLN: 25.0000 ug | INTRAMUSCULAR | Status: DC | PRN

## 2015-02-09 MED ORDER — FENTANYL CITRATE (PF) 100 MCG/2ML IJ SOLN
INTRAMUSCULAR | Status: DC | PRN
  Administered 2015-02-09: 15 ug via INTRAVENOUS

## 2015-02-09 MED ORDER — CEFAZOLIN SODIUM-DEXTROSE 2-3 GM-% IV SOLR: 2.0000 g | Freq: Once | INTRAVENOUS | Status: DC

## 2015-02-09 MED ORDER — KETOROLAC TROMETHAMINE 30 MG/ML IJ SOLN: 30.0000 mg | Freq: Four times a day (QID) | INTRAMUSCULAR | Status: DC | PRN

## 2015-02-09 MED ORDER — NALOXONE HCL 0.4 MG/ML IJ SOLN
0.4000 mg | INTRAMUSCULAR | Status: DC | PRN
Start: 2015-02-09 — End: 2015-02-12

## 2015-02-09 MED ORDER — PHENYLEPHRINE HCL 10 MG/ML IJ SOLN
INTRAMUSCULAR | Status: DC | PRN
  Administered 2015-02-09: 100 ug via INTRAVENOUS

## 2015-02-09 MED ORDER — LACTATED RINGERS IV SOLN
INTRAVENOUS | Status: DC
  Administered 2015-02-09 (×2): via INTRAVENOUS

## 2015-02-09 MED ORDER — OXYCODONE-ACETAMINOPHEN 5-325 MG PO TABS
1.0000 | ORAL_TABLET | ORAL | Status: DC | PRN
  Administered 2015-02-11 – 2015-02-12 (×5): 1 via ORAL
  Filled 2015-02-09 (×5): qty 1

## 2015-02-09 MED ORDER — IBUPROFEN 600 MG PO TABS
600.0000 mg | ORAL_TABLET | Freq: Four times a day (QID) | ORAL | Status: DC | PRN
  Administered 2015-02-10 – 2015-02-12 (×7): 600 mg via ORAL
  Filled 2015-02-09 (×7): qty 1

## 2015-02-09 MED ORDER — CEFAZOLIN SODIUM-DEXTROSE 2-3 GM-% IV SOLR
INTRAVENOUS | Status: AC
  Filled 2015-02-09: qty 50

## 2015-02-09 MED ORDER — OXYTOCIN 40 UNITS IN LACTATED RINGERS INFUSION - SIMPLE MED: 62.5000 mL/h | INTRAVENOUS | Status: DC

## 2015-02-09 MED ORDER — BUPIVACAINE IN DEXTROSE 0.75-8.25 % IT SOLN
INTRATHECAL | Status: DC | PRN
  Administered 2015-02-09: 1.6 mL via INTRATHECAL

## 2015-02-09 MED ORDER — ONDANSETRON HCL 4 MG/2ML IJ SOLN
INTRAMUSCULAR | Status: DC | PRN
  Administered 2015-02-09: 4 mg via INTRAVENOUS

## 2015-02-09 SURGICAL SUPPLY — 23 items
BARRIER ADHS 3X4 INTERCEED (GAUZE/BANDAGES/DRESSINGS) ×3 IMPLANT
CANISTER SUCT 3000ML (MISCELLANEOUS) ×3 IMPLANT
CATH KIT ON-Q SILVERSOAK 5IN (CATHETERS) ×6 IMPLANT
CHLORAPREP W/TINT 26ML (MISCELLANEOUS) ×3 IMPLANT
DRSG TELFA 3X8 NADH (GAUZE/BANDAGES/DRESSINGS) ×3 IMPLANT
ELECT CAUTERY BLADE 6.4 (BLADE) ×3 IMPLANT
GAUZE SPONGE 4X4 12PLY STRL (GAUZE/BANDAGES/DRESSINGS) ×3 IMPLANT
GLOVE BIO SURGEON STRL SZ8 (GLOVE) ×3 IMPLANT
GOWN STRL REUS W/ TWL LRG LVL3 (GOWN DISPOSABLE) ×2 IMPLANT
GOWN STRL REUS W/ TWL XL LVL3 (GOWN DISPOSABLE) ×1 IMPLANT
GOWN STRL REUS W/TWL LRG LVL3 (GOWN DISPOSABLE) ×4
GOWN STRL REUS W/TWL XL LVL3 (GOWN DISPOSABLE) ×2
LIQUID BAND (GAUZE/BANDAGES/DRESSINGS) ×3 IMPLANT
NS IRRIG 1000ML POUR BTL (IV SOLUTION) ×3 IMPLANT
PACK C SECTION AR (MISCELLANEOUS) ×3 IMPLANT
PAD GROUND ADULT SPLIT (MISCELLANEOUS) ×3 IMPLANT
PAD OB MATERNITY 4.3X12.25 (PERSONAL CARE ITEMS) ×3 IMPLANT
PAD PREP 24X41 OB/GYN DISP (PERSONAL CARE ITEMS) ×3 IMPLANT
STAPLER INSORB 30 2030 C-SECTI (MISCELLANEOUS) ×3 IMPLANT
STRAP SAFETY BODY (MISCELLANEOUS) ×3 IMPLANT
SUT CHROMIC 1 CTX 36 (SUTURE) ×9 IMPLANT
SUT PLAIN GUT 0 (SUTURE) IMPLANT
SUT VIC AB 0 CT1 36 (SUTURE) ×6 IMPLANT

## 2015-02-09 NOTE — Anesthesia Preprocedure Evaluation (Signed)
Anesthesia Evaluation  Patient identified by MRN, date of birth, ID band Patient awake    Reviewed: Allergy & Precautions, H&P , NPO status , Patient's Chart, lab work & pertinent test results  History of Anesthesia Complications (+) history of anesthetic complications (High spinal in setting of epidural)  Airway Mallampati: II  TM Distance: >3 FB Neck ROM: full    Dental no notable dental hx. (+) Teeth Intact   Pulmonary neg pulmonary ROS, neg shortness of breath,    Pulmonary exam normal breath sounds clear to auscultation       Cardiovascular Exercise Tolerance: Good (-) hypertensionnegative cardio ROS Normal cardiovascular exam Rhythm:regular Rate:Normal     Neuro/Psych negative neurological ROS  negative psych ROS   GI/Hepatic negative GI ROS, Neg liver ROS,   Endo/Other  negative endocrine ROS  Renal/GU negative Renal ROS  negative genitourinary   Musculoskeletal   Abdominal   Peds  Hematology negative hematology ROS (+)   Anesthesia Other Findings History reviewed. No pertinent past medical history.  Past Surgical History:   CESAREAN SECTION                                N/A 2013         WISDOM TOOTH EXTRACTION                                      BMI    Body Mass Index   28.74 kg/m 2      Reproductive/Obstetrics (+) Pregnancy                             Anesthesia Physical Anesthesia Plan  ASA: II  Anesthesia Plan: Spinal   Post-op Pain Management:    Induction:   Airway Management Planned:   Additional Equipment:   Intra-op Plan:   Post-operative Plan:   Informed Consent: I have reviewed the patients History and Physical, chart, labs and discussed the procedure including the risks, benefits and alternatives for the proposed anesthesia with the patient or authorized representative who has indicated his/her understanding and acceptance.   Dental Advisory  Given  Plan Discussed with: Anesthesiologist, CRNA and Surgeon  Anesthesia Plan Comments:         Anesthesia Quick Evaluation

## 2015-02-09 NOTE — Progress Notes (Signed)
Brief op note  preop diagnosis: elective repeat c/s  Postop : same  Procedure  : repeat LTCS + on q placement Finding : vigorous female  APGARS : 8/9  Weight 3260 gm , 8/5# No complications  EBL 500 cc IOF 600cc

## 2015-02-09 NOTE — Transfer of Care (Signed)
Immediate Anesthesia Transfer of Care Note  Patient: Nathaneil CanaryLindsay D Duchemin  Procedure(s) Performed: Procedure(s): CESAREAN SECTION (N/A)  Patient Location: PACU  Anesthesia Type:Spinal  Level of Consciousness: awake, alert  and oriented  Airway & Oxygen Therapy: Patient Spontanous Breathing  Post-op Assessment: Report given to RN and Post -op Vital signs reviewed and stable  Post vital signs: Reviewed  Last Vitals:  Filed Vitals:   02/09/15 0722 02/09/15 0856  BP: 122/73 110/55  Pulse: 61 77  Temp: 36.6 C 35.3 C  Resp: 17 18    Complications: No apparent anesthesia complications

## 2015-02-09 NOTE — Progress Notes (Signed)
Pt interviewed this am . She is scheduled for a repeat c/s  + on q pump . All questions answered .

## 2015-02-09 NOTE — Anesthesia Procedure Notes (Signed)
Spinal Patient location during procedure: OR Start time: 02/09/2015 7:44 AM End time: 02/09/2015 7:55 AM Staffing Anesthesiologist: Yevette EdwardsADAMS, JAMES G Resident/CRNA: Ginger CarneMICHELET, Cleland Simkins Performed by: resident/CRNA  Preanesthetic Checklist Completed: patient identified, site marked, surgical consent, pre-op evaluation, timeout performed, IV checked, risks and benefits discussed and monitors and equipment checked Spinal Block Patient position: sitting Prep: ChloraPrep Patient monitoring: continuous pulse ox and blood pressure Approach: midline Location: L3-4 Injection technique: single-shot Needle Needle type: Whitacre  Needle gauge: 25 G Needle length: 9 cm Assessment Sensory level: T4

## 2015-02-10 LAB — CBC
HCT: 28.4 % — ABNORMAL LOW (ref 35.0–47.0)
HEMOGLOBIN: 9.7 g/dL — AB (ref 12.0–16.0)
MCH: 29.5 pg (ref 26.0–34.0)
MCHC: 34.2 g/dL (ref 32.0–36.0)
MCV: 86.3 fL (ref 80.0–100.0)
PLATELETS: 112 10*3/uL — AB (ref 150–440)
RBC: 3.29 MIL/uL — ABNORMAL LOW (ref 3.80–5.20)
RDW: 13 % (ref 11.5–14.5)
WBC: 8.6 10*3/uL (ref 3.6–11.0)

## 2015-02-10 NOTE — Progress Notes (Signed)
Subjective: Postpartum Day 1: Cesarean Delivery Patient reports incisional pain.    Objective: Vital signs in last 24 hours: Temp:  [95.6 F (35.3 C)-98.7 F (37.1 C)] 98 F (36.7 C) (11/29 0434) Pulse Rate:  [54-82] 77 (11/29 0434) Resp:  [16-20] 18 (11/29 0434) BP: (90-122)/(53-88) 120/60 mmHg (11/29 0434) SpO2:  [95 %-100 %] 98 % (11/28 1704)  Physical Exam:  General: alert and cooperative Lochia: appropriate Lungs cta  Uterine Fundus: firm Incision: no significant drainage DVT Evaluation: No evidence of DVT seen on physical exam.   Recent Labs  02/09/15 0608 02/10/15 0516  HGB 11.2* 9.7*  HCT 32.0* 28.4*    Assessment/Plan: Status post Cesarean section. Doing well postoperatively.  Continue current care. D/c IV + Foley  SCHERMERHORN,THOMAS 02/10/2015, 7:51 AM

## 2015-02-10 NOTE — Op Note (Signed)
Heidi Bonilla, Heidi Bonilla             ACCOUNT NO.:  [id number]  MEDICAL RECORD NO.:  [id number]  LOCATION:  339A                         FACILITY:  [facility]  PHYSICIAN:  Jennell Cornerhomas Sanaai Doane, MDDATE OF BIRTH:  1985-10-26  DATE OF PROCEDURE:  02/09/2015 DATE OF DISCHARGE:                              OPERATIVE REPORT   PREOPERATIVE DIAGNOSIS: 1. 39+ 3 weeks estimated gestational age. 2. Elective repeat cesarean section.  POSTOPERATIVE DIAGNOSIS: 1. 39+ 3 weeks estimated gestational age. 2. Elective repeat cesarean section.  PROCEDURE: 1. Repeat low transverse cesarean section. 2. On-Q pump placement.  ANESTHESIA:  Spinal.  SURGEON:  Jennell Cornerhomas Cloy Cozzens, MD.  FIRST ASSISTANT:  Dalbert GarnetBeasley.  INDICATION:  A 29 year old, gravida 2, para 1 patient at 39+ 3 weeks with a prior cesarean section.  The patient elects for repeat low transverse cesarean section.  DESCRIPTION OF PROCEDURE:  After adequate spinal anesthesia, the patient was placed in dorsal supine position.  Hip roll was placed under the right side.  The patient's abdomen was prepped and draped in normal sterile fashion.  A Pfannenstiel incision was made 2 fingerbreadths above the symphysis pubis.  Sharp dissection was used to identify the fascia.  Fascia was opened in midline and opened in a transverse fashion.  The superior aspect of fascia was grasped with Kocher clamps and the recti muscles were dissected free.  The inferior aspect of the fascia was grasped with Kocher clamps, and the pyramidalis muscle was dissected free.  Entry into the peritoneal cavity was accomplished sharply.  The vesicouterine peritoneal fold was identified and opened, and the bladder was reflected inferiorly.  A low transverse uterine incision was made upon entry into the endometrial cavity.  Clear fluid resulted.  The incision was extended with blunt transverse traction. Fetal head was brought to the incision and the fetal head was  delivered without difficulty followed by shoulders and body.  A large vigorous female was passed to the nursery staff, who assigned Apgar scores of 8 and 9.  The placenta was manually delivered, and the uterus was exteriorized.  The endometrial cavity was wiped clean with laparotomy tape.  The uterine incision was closed with 1 chromic Vicryl in a running, locking fashion.  Good hemostasis noted.  Fallopian tubes and ovaries appeared normal.  Posterior cul-de-sac was irrigated and suctioned.  Uterus was placed back into the abdominal cavity, and the pericolic gutters were wiped clean with laparotomy tape.  The uterine incision again appeared hemostatic and Interceed was placed over the uterine incision in a T-shaped fashion.  The superior aspect of the fascia was grasped with Kocher clamps, and the On-Q pump catheters were advanced from infraumbilical position to a subfascial position.  The fascia was then closed over top these catheters with 0 Vicryl suture in a running, nonlocking fashion.  Good hemostasis.  Good approximation of tissues.  Subcutaneous tissues were irrigated and bovied for hemostasis, and the skin was reapproximated with Insorb absorbable staples with good cosmetic effect.  The On-Q pump catheters were secured at the skin level with LiquiBand and Steri-Strips to the skin and Tegaderm was placed over the catheters.  Each catheter was loaded with 5 mL of 0.5% Marcaine. There were no complications.  ESTIMATED BLOOD LOSS:  500 mL.  INTRAOP FLUIDS:  600 mL.  The patient tolerated the procedure well and was taken to the recovery room in good condition.          ______________________________ Jennell Corner, MD     TS/MEDQ  D:  02/09/2015  T:  02/10/2015  Job:  [id number]

## 2015-02-10 NOTE — Anesthesia Postprocedure Evaluation (Signed)
Anesthesia Post Note  Patient: Heidi Bonilla  Procedure(s) Performed: Procedure(s) (LRB): CESAREAN SECTION (N/A)  Patient location during evaluation: Mother Baby Anesthesia Type: Spinal Level of consciousness: awake, awake and alert, oriented and patient cooperative Pain management: satisfactory to patient Vital Signs Assessment: post-procedure vital signs reviewed and stable Respiratory status: spontaneous breathing and respiratory function stable Cardiovascular status: stable Postop Assessment: no headache, no backache, no signs of nausea or vomiting, adequate PO intake and patient able to bend at knees Anesthetic complications: no    Last Vitals:  Filed Vitals:   02/10/15 0019 02/10/15 0434  BP: 90/53 120/60  Pulse: 59 77  Temp: 37.1 C 36.7 C  Resp: 18 18    Last Pain:  Filed Vitals:   02/10/15 0435  PainSc: 0-No pain                 Michaele OfferSavage,  Jaleel Allen A

## 2015-02-10 NOTE — Anesthesia Post-op Follow-up Note (Signed)
  Anesthesia Pain Follow-up Note  Patient: Nathaneil CanaryLindsay D Laflam  Day #: 1  Date of Follow-up: 02/10/2015 Time: 8:05 AM  Last Vitals:  Filed Vitals:   02/10/15 0019 02/10/15 0434  BP: 90/53 120/60  Pulse: 59 77  Temp: 37.1 C 36.7 C  Resp: 18 18    Level of Consciousness: alert  Pain: none   Side Effects:None  Catheter Site Exam:clean, dry, no drainage  Plan: D/C from anesthesia care  Michaele OfferSavage,  Yonis Carreon A

## 2015-02-11 NOTE — Progress Notes (Signed)
Subjective: Postpartum Day 2: Cesarean Delivery Patient reports tolerating PO. Pain controlled well.  Objective: Vital signs in last 24 hours: Temp:  [98.2 F (36.8 C)-98.6 F (37 C)] 98.5 F (36.9 C) (11/30 1600) Pulse Rate:  [60-80] 61 (11/30 1600) Resp:  [18-20] 20 (11/30 1600) BP: (108-118)/(61-75) 118/61 mmHg (11/30 1600) SpO2:  [98 %] 98 % (11/29 1923)  Physical Exam:  General: alert, cooperative and no distress Lochia: appropriate Uterine Fundus: firm Incision: healing well, no significant drainage, no dehiscence, no significant erythema DVT Evaluation: No evidence of DVT seen on physical exam.   Recent Labs  02/09/15 0608 02/10/15 0516  HGB 11.2* 9.7*  HCT 32.0* 28.4*    Assessment/Plan: Status post Cesarean section. Doing well postoperatively.  Continue current care.  Christeen DouglasBEASLEY, Kiara Keep 02/11/2015, 5:13 PM

## 2015-02-12 MED ORDER — OXYCODONE-ACETAMINOPHEN 5-325 MG PO TABS: 1.0000 | ORAL_TABLET | ORAL | Status: DC | PRN

## 2015-02-12 MED ORDER — IBUPROFEN 600 MG PO TABS: 600.0000 mg | ORAL_TABLET | Freq: Four times a day (QID) | ORAL | Status: DC | PRN

## 2015-02-12 NOTE — Discharge Summary (Signed)
Obstetric Discharge Summary Reason for Admission: cesarean section Prenatal Procedures: none Intrapartum Procedures: cesarean: low cervical, transverse Postpartum Procedures: none Complications-Operative and Postpartum: none HEMOGLOBIN  Date Value Ref Range Status  02/10/2015 9.7* 12.0 - 16.0 g/dL Final   HGB  Date Value Ref Range Status  12/08/2011 12.1 12.0-16.0 g/dL Final   HCT  Date Value Ref Range Status  02/10/2015 28.4* 35.0 - 47.0 % Final  12/10/2011 33.0* 35.0-47.0 % Final    Physical Exam:  General: alert and cooperative Lochia: appropriate Lungs CTA  cv RRR  Uterine Fundus: firm Incision: healing well DVT Evaluation: No evidence of DVT seen on physical exam.  Discharge Diagnoses: Term Pregnancy-delivered  Discharge Information: Date: 02/12/2015 Activity: pelvic rest Diet: routine Medications: Ibuprofen, Percocet and micronor  Condition: stable Instructions: refer to practice specific booklet Discharge to: home Follow-up Information    Follow up with Heidi Deike, MD In 2 weeks.   Specialty:  Obstetrics and Gynecology   Why:  For wound re-check   Contact information:   [address] [facility] [place] [address] [phone]       Newborn Data: Live born female  Birth Weight: 8 lb 4.6 oz (3760 g) APGAR: 8, 9  Home with mother.  Heidi Bonilla 02/12/2015, 9:27 AM

## 2015-02-12 NOTE — Progress Notes (Signed)
Patient discharged home with infant. Vital signs stable, bleeding within normal limits, uterus firm. Discharge instructions, prescriptions, and follow up appointment given to and reviewed with patient. Patient verbalized understanding, all questions answered. Escorted in wheelchair by auxiliary.    

## 2015-02-12 NOTE — Discharge Instructions (Signed)
Cesarean Delivery, Care After Refer to this sheet in the next few weeks. These instructions provide you with information on caring for yourself after your procedure. Your health care provider may also give you specific instructions. Your treatment has been planned according to current medical practices, but problems sometimes occur. Call your health care provider if you have any problems or questions after you go home. HOME CARE INSTRUCTIONS  Only take over-the-counter or prescription medications as directed by your health care provider.  Do not drink alcohol, especially if you are breastfeeding or taking medication to relieve pain.  Do not chew or smoke tobacco.  Continue to use good perineal care. Good perineal care includes:  Wiping your perineum from front to back.  Keeping your perineum clean.  Check your surgical cut (incision) daily for increased redness, drainage, swelling, or separation of skin.  Clean your incision gently with soap and water every day, and then pat it dry. If your health care provider says it is okay, leave the incision uncovered. Use a bandage (dressing) if the incision is draining fluid or appears irritated. If the adhesive strips across the incision do not fall off within 7 days, carefully peel them off.  Hug a pillow when coughing or sneezing until your incision is healed. This helps to relieve pain.  Do not use tampons or douche for the next 6 weeks.   Showers only, no tub baths for the next 6 weeks.   Wear a well-fitting bra that provides breast support.  Limit wearing support panties or control-top hose.  Drink enough fluids to keep your urine clear or pale yellow.  Eat high-fiber foods such as whole grain cereals and breads, brown rice, beans, and fresh fruits and vegetables every day. These foods may help prevent or relieve constipation.  Resume activities such as climbing stairs, driving, lifting, exercising, or traveling as directed by your  health care provider.  No sexual intercourse for at least the next 6 weeks, and until you are interested.   Try to have someone help you with your household activities and your newborn for at least a few days after you leave the hospital.  Rest as much as possible. Try to rest or take a nap when your newborn is sleeping.  Increase your activities gradually.  Keep all of your scheduled postpartum appointments. It is very important to keep your scheduled follow-up appointments. At these appointments, your health care provider will be checking to make sure that you are healing physically and emotionally. SEEK MEDICAL CARE IF:   You are passing large clots from your vagina. Save any clots to show your health care provider.  You have a foul smelling discharge from your vagina.  You have trouble urinating.  You are urinating frequently.  You have pain when you urinate.  You have a change in your bowel movements.  You have increasing redness, pain, or swelling near your incision.  You have pus draining from your incision.  Your incision is separating.  You have painful, hard, or reddened breasts.  You have a severe headache.  You have blurred vision or see spots.  You feel sad or depressed.  You have thoughts of hurting yourself or your newborn.  You have questions about your care, the care of your newborn, or medications.  You are dizzy or light-headed.  You have a rash.  You have pain, redness, or swelling at the site of the removed intravenous access (IV) tube.  You have nausea or vomiting.  You stopped breastfeeding and have not had a menstrual period within 12 weeks of stopping.  You are not breastfeeding and have not had a menstrual period within 12 weeks of delivery.  You have a fever. SEEK IMMEDIATE MEDICAL CARE IF:  You have persistent pain.  You have chest pain.  You have shortness of breath.  You faint.  You have leg pain.  You have stomach  pain.  Your vaginal bleeding saturates 2 or more sanitary pads in 1 hour. MAKE SURE YOU:   Understand these instructions.  Will watch your condition.  Will get help right away if you are not doing well or get worse.   This information is not intended to replace advice given to you by your health care provider. Make sure you discuss any questions you have with your health care provider.   Document Released: 11/20/2001 Document Revised: 03/21/2014 Document Reviewed: 10/26/2011 Elsevier Interactive Patient Education [employer]2016 Elsevier Inc.

## 2015-09-29 ENCOUNTER — Encounter (HOSPITAL_COMMUNITY): Payer: Self-pay | Admitting: *Deleted

## 2015-11-13 ENCOUNTER — Encounter: Payer: Self-pay | Admitting: Family Medicine

## 2015-11-13 ENCOUNTER — Ambulatory Visit (INDEPENDENT_AMBULATORY_CARE_PROVIDER_SITE_OTHER): Payer: BC Managed Care – PPO | Admitting: Family Medicine

## 2015-11-13 VITALS — BP 111/66 | HR 66 | Temp 98.5°F | Resp 16 | Ht 66.0 in | Wt 171.0 lb

## 2015-11-13 DIAGNOSIS — E785 Hyperlipidemia, unspecified: Secondary | ICD-10-CM

## 2015-11-13 DIAGNOSIS — Z201 Contact with and (suspected) exposure to tuberculosis: Secondary | ICD-10-CM | POA: Diagnosis not present

## 2015-11-13 DIAGNOSIS — Z0000 Encounter for general adult medical examination without abnormal findings: Secondary | ICD-10-CM

## 2015-11-13 DIAGNOSIS — D509 Iron deficiency anemia, unspecified: Secondary | ICD-10-CM

## 2015-11-13 LAB — CBC WITH DIFFERENTIAL/PLATELET
BASOS ABS: 56 {cells}/uL (ref 0–200)
Basophils Relative: 1 %
EOS PCT: 1 %
Eosinophils Absolute: 56 cells/uL (ref 15–500)
HCT: 39.6 % (ref 35.0–45.0)
Hemoglobin: 13.3 g/dL (ref 11.7–15.5)
LYMPHS PCT: 36 %
Lymphs Abs: 2016 cells/uL (ref 850–3900)
MCH: 28.1 pg (ref 27.0–33.0)
MCHC: 33.6 g/dL (ref 32.0–36.0)
MCV: 83.5 fL (ref 80.0–100.0)
MONOS PCT: 7 %
MPV: 11.1 fL (ref 7.5–12.5)
Monocytes Absolute: 392 cells/uL (ref 200–950)
NEUTROS ABS: 3080 {cells}/uL (ref 1500–7800)
NEUTROS PCT: 55 %
PLATELETS: 247 10*3/uL (ref 140–400)
RBC: 4.74 MIL/uL (ref 3.80–5.10)
RDW: 12.9 % (ref 11.0–15.0)
WBC: 5.6 10*3/uL (ref 3.8–10.8)

## 2015-11-13 NOTE — Progress Notes (Signed)
Subjective:    Patient ID: Heidi Bonilla, female    DOB: May 01, 1985, 30 y.o.   MRN: [id number]  HPI: Heidi Bonilla is a 30 y.o. female presenting on 11/13/2015 for Employment Physical   HPI  Pt presents for pre-employment physical. All vaccines are UTD. Recent pregnancy- had a  TDAP booster last year.  Doing well. No concerns about her health. 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 Is switching to [employer]. Sees GYN regualarly. All paps UTD.    Past Medical History:  Diagnosis Date  . Anemia    Social History   Social History  . Marital status: Married    Spouse name: N/A  . Number of children: N/A  . Years of education: N/A   Occupational History  . Not on file.   Social History Main Topics  . Smoking status: Never Smoker  . Smokeless tobacco: Never Used  . Alcohol use No  . Drug use: No  . Sexual activity: Not on file   Other Topics Concern  . Not on file   Social History Narrative  . No narrative on file   History reviewed. No pertinent family history. Current Outpatient Prescriptions on File Prior to Visit  Medication Sig  . Iron-Vitamin C (IRON 100/C PO) Take 65 mg by mouth once.   No current facility-administered medications on file prior to visit.     Review of Systems  Constitutional: Negative for chills and fever.  HENT: Negative.   Respiratory: Negative for cough, chest tightness and wheezing.   Cardiovascular: Negative for chest pain and leg swelling.  Gastrointestinal: Negative for abdominal pain, constipation, diarrhea, nausea and vomiting.  Endocrine: Negative.  Negative for cold intolerance, heat intolerance, polydipsia, polyphagia and polyuria.  Genitourinary: Negative for difficulty urinating and dysuria.  Musculoskeletal: Negative.   Neurological: Negative for dizziness, light-headedness and numbness.  Psychiatric/Behavioral: Negative.    Per HPI unless specifically indicated above     Objective:    BP  111/66 (BP Location: Left Arm, Patient Position: Sitting, Cuff Size: Normal)   Pulse 66   Temp 98.5 F (36.9 C) (Oral)   Resp 16   Ht 5\' 6"  (1.676 m)   Wt 171 lb (77.6 kg)   LMP 10/20/2015   BMI 27.60 kg/m   Wt Readings from Last 3 Encounters:  11/13/15 171 lb (77.6 kg)  02/09/15 178 lb (80.7 kg)  02/06/15 178 lb (80.7 kg)    Physical Exam  Constitutional: She is oriented to person, place, and time. She appears well-developed and well-nourished.  HENT:  Head: Normocephalic and atraumatic.  Neck: Neck supple.  Cardiovascular: Normal rate, regular rhythm and normal heart sounds.  Exam reveals no gallop and no friction rub.   No murmur heard. Pulmonary/Chest: Effort normal and breath sounds normal. She has no wheezes. She exhibits no tenderness.  Abdominal: Soft. Normal appearance and bowel sounds are normal. She exhibits no distension and no mass. There is no tenderness. There is no rebound and no guarding.  Musculoskeletal: Normal range of motion. She exhibits no edema or tenderness.  Lymphadenopathy:    She has no cervical adenopathy.  Neurological: She is alert and oriented to person, place, and time.  Skin: Skin is warm and dry.   Results for orders placed or performed during the hospital encounter of 02/09/15  CBC on admission  Result Value Ref Range   WBC 6.9 3.6 - 11.0 K/uL   RBC 3.71 (L) 3.80 - 5.20 MIL/uL  Hemoglobin 11.2 (L) 12.0 - 16.0 g/dL   HCT 44.032.0 (L) 10.235.0 - 72.547.0 %   MCV 86.0 80.0 - 100.0 fL   MCH 30.1 26.0 - 34.0 pg   MCHC 35.0 32.0 - 36.0 g/dL   RDW 36.612.8 44.011.5 - 34.714.5 %   Platelets 127 (L) 150 - 440 K/uL  CBC  Result Value Ref Range   WBC 8.6 3.6 - 11.0 K/uL   RBC 3.29 (L) 3.80 - 5.20 MIL/uL   Hemoglobin 9.7 (L) 12.0 - 16.0 g/dL   HCT 42.528.4 (L) 95.635.0 - 38.747.0 %   MCV 86.3 80.0 - 100.0 fL   MCH 29.5 26.0 - 34.0 pg   MCHC 34.2 32.0 - 36.0 g/dL   RDW 56.413.0 33.211.5 - 95.114.5 %   Platelets 112 (L) 150 - 440 K/uL      Assessment & Plan:   Problem List Items  Addressed This Visit    None    Visit Diagnoses    Annual physical exam    -  Primary   Health maintenance reviewed. Doing well. Clear for her job.    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    Relevant Orders   Quantiferon tb gold assay   Iron deficiency anemia       Anemic post-partum following C-section.  Will check to determine if it returned to normal    Relevant Orders   CBC with Differential   Mild hyperlipidemia       Relevant Orders   Lipid Profile      Meds ordered this encounter  Medications  . JOLIVETTE 0.35 MG tablet      Follow up plan: No Follow-up on file.

## 2015-11-13 NOTE — Patient Instructions (Addendum)
Health Maintenance, Female Adopting a healthy lifestyle and getting preventive care can go a long way to promote health and wellness. Talk with your health care provider about what schedule of regular examinations is right for you. This is a good chance for you to check in with your provider about disease prevention and staying healthy. In between checkups, there are plenty of things you can do on your own. Experts have done a lot of research about which lifestyle changes and preventive measures are most likely to keep you healthy. Ask your health care provider for more information. WEIGHT AND DIET  Eat a healthy diet  Be sure to include plenty of vegetables, fruits, low-fat dairy products, and lean protein.  Do not eat a lot of foods high in solid fats, added sugars, or salt.  Get regular exercise. This is one of the most important things you can do for your health.  Most adults should exercise for at least 150 minutes each week. The exercise should increase your heart rate and make you sweat (moderate-intensity exercise).  Most adults should also do strengthening exercises at least twice a week. This is in addition to the moderate-intensity exercise.  Maintain a healthy weight  Body mass index (BMI) is a measurement that can be used to identify possible weight problems. It estimates body fat based on height and weight. Your health care provider can help determine your BMI and help you achieve or maintain a healthy weight.  For females 20 years of age and older:   A BMI below 18.5 is considered underweight.  A BMI of 18.5 to 24.9 is normal.  A BMI of 25 to 29.9 is considered overweight.  A BMI of 30 and above is considered obese.  Watch levels of cholesterol and blood lipids  You should start having your blood tested for lipids and cholesterol at 32 years of age, then have this test every 5 years.  You may need to have your cholesterol levels checked more often if:  Your lipid  or cholesterol levels are high.  You are older than 32 years of age.  You are at high risk for heart disease.  CANCER SCREENING   Lung Cancer  Lung cancer screening is recommended for adults 55-80 years old who are at high risk for lung cancer because of a history of smoking.  A yearly low-dose CT scan of the lungs is recommended for people who:  Currently smoke.  Have quit within the past 15 years.  Have at least a 30-pack-year history of smoking. A pack year is smoking an average of one pack of cigarettes a day for 1 year.  Yearly screening should continue until it has been 15 years since you quit.  Yearly screening should stop if you develop a health problem that would prevent you from having lung cancer treatment.  Breast Cancer  Practice breast self-awareness. This means understanding how your breasts normally appear and feel.  It also means doing regular breast self-exams. Let your health care provider know about any changes, no matter how small.  If you are in your 20s or 30s, you should have a clinical breast exam (CBE) by a health care provider every 1-3 years as part of a regular health exam.  If you are 40 or older, have a CBE every year. Also consider having a breast X-ray (mammogram) every year.  If you have a family history of breast cancer, talk to your health care provider about genetic screening.  If you   are at high risk for breast cancer, talk to your health care provider about having an MRI and a mammogram every year.  Breast cancer gene (BRCA) assessment is recommended for women who have family members with BRCA-related cancers. BRCA-related cancers include:  Breast.  Ovarian.  Tubal.  Peritoneal cancers.  Results of the assessment will determine the need for genetic counseling and BRCA1 and BRCA2 testing. Cervical Cancer Your health care provider may recommend that you be screened regularly for cancer of the pelvic organs (ovaries, uterus, and  vagina). This screening involves a pelvic examination, including checking for microscopic changes to the surface of your cervix (Pap test). You may be encouraged to have this screening done every 3 years, beginning at age 21.  For women ages 30-65, health care providers may recommend pelvic exams and Pap testing every 3 years, or they may recommend the Pap and pelvic exam, combined with testing for human papilloma virus (HPV), every 5 years. Some types of HPV increase your risk of cervical cancer. Testing for HPV may also be done on women of any age with unclear Pap test results.  Other health care providers may not recommend any screening for nonpregnant women who are considered low risk for pelvic cancer and who do not have symptoms. Ask your health care provider if a screening pelvic exam is right for you.  If you have had past treatment for cervical cancer or a condition that could lead to cancer, you need Pap tests and screening for cancer for at least 20 years after your treatment. If Pap tests have been discontinued, your risk factors (such as having a new sexual partner) need to be reassessed to determine if screening should resume. Some women have medical problems that increase the chance of getting cervical cancer. In these cases, your health care provider may recommend more frequent screening and Pap tests. Colorectal Cancer  This type of cancer can be detected and often prevented.  Routine colorectal cancer screening usually begins at 32 years of age and continues through 32 years of age.  Your health care provider may recommend screening at an earlier age if you have risk factors for colon cancer.  Your health care provider may also recommend using home test kits to check for hidden blood in the stool.  A small camera at the end of a tube can be used to examine your colon directly (sigmoidoscopy or colonoscopy). This is done to check for the earliest forms of colorectal  cancer.  Routine screening usually begins at age 50.  Direct examination of the colon should be repeated every 5-10 years through 32 years of age. However, you may need to be screened more often if early forms of precancerous polyps or small growths are found. Skin Cancer  Check your skin from head to toe regularly.  Tell your health care provider about any new moles or changes in moles, especially if there is a change in a mole's shape or color.  Also tell your health care provider if you have a mole that is larger than the size of a pencil eraser.  Always use sunscreen. Apply sunscreen liberally and repeatedly throughout the day.  Protect yourself by wearing long sleeves, pants, a wide-brimmed hat, and sunglasses whenever you are outside. HEART DISEASE, DIABETES, AND HIGH BLOOD PRESSURE   High blood pressure causes heart disease and increases the risk of stroke. High blood pressure is more likely to develop in:  People who have blood pressure in the high end   of the normal range (130-139/85-89 mm Hg).  People who are overweight or obese.  People who are African American.  If you are 42-97 years of age, have your blood pressure checked every 3-5 years. If you are 90 years of age or older, have your blood pressure checked every year. You should have your blood pressure measured twice--once when you are at a hospital or clinic, and once when you are not at a hospital or clinic. Record the average of the two measurements. To check your blood pressure when you are not at a hospital or clinic, you can use:  An automated blood pressure machine at a pharmacy.  A home blood pressure monitor.  If you are between 18 years and 65 years old, ask your health care provider if you should take aspirin to prevent strokes.  Have regular diabetes screenings. This involves taking a blood sample to check your fasting blood sugar level.  If you are at a normal weight and have a low risk for diabetes,  have this test once every three years after 32 years of age.  If you are overweight and have a high risk for diabetes, consider being tested at a younger age or more often. PREVENTING INFECTION  Hepatitis B  If you have a higher risk for hepatitis B, you should be screened for this virus. You are considered at high risk for hepatitis B if:  You were born in a country where hepatitis B is common. Ask your health care provider which countries are considered high risk.  Your parents were born in a high-risk country, and you have not been immunized against hepatitis B (hepatitis B vaccine).  You have HIV or AIDS.  You use needles to inject street drugs.  You live with someone who has hepatitis B.  You have had sex with someone who has hepatitis B.  You get hemodialysis treatment.  You take certain medicines for conditions, including cancer, organ transplantation, and autoimmune conditions. Hepatitis C  Blood testing is recommended for:  Everyone born from [birth year] through 1967.  Anyone with known risk factors for hepatitis C. Sexually transmitted infections (STIs)  You should be screened for sexually transmitted infections (STIs) including gonorrhea and chlamydia if:  You are sexually active and are younger than 32 years of age.  You are older than 32 years of age and your health care provider tells you that you are at risk for this type of infection.  Your sexual activity has changed since you were last screened and you are at an increased risk for chlamydia or gonorrhea. Ask your health care provider if you are at risk.  If you do not have HIV, but are at risk, it may be recommended that you take a prescription medicine daily to prevent HIV infection. This is called pre-exposure prophylaxis (PrEP). You are considered at risk if:  You are sexually active and do not regularly use condoms or know the HIV status of your partner(s).  You take drugs by injection.  You are sexually  active with a partner who has HIV. Talk with your health care provider about whether you are at high risk of being infected with HIV. If you choose to begin PrEP, you should first be tested for HIV. You should then be tested every 3 months for as long as you are taking PrEP.  PREGNANCY   If you are premenopausal and you may become pregnant, ask your health care provider about preconception counseling.  If you may  become pregnant, take 400 to 800 micrograms (mcg) of folic acid every day.  If you want to prevent pregnancy, talk to your health care provider about birth control (contraception). OSTEOPOROSIS AND MENOPAUSE   Osteoporosis is a disease in which the bones lose minerals and strength with aging. This can result in serious bone fractures. Your risk for osteoporosis can be identified using a bone density scan.  If you are 30 years of age or older, or if you are at risk for osteoporosis and fractures, ask your health care provider if you should be screened.  Ask your health care provider whether you should take a calcium or vitamin D supplement to lower your risk for osteoporosis.  Menopause may have certain physical symptoms and risks.  Hormone replacement therapy may reduce some of these symptoms and risks. Talk to your health care provider about whether hormone replacement therapy is right for you.  HOME CARE INSTRUCTIONS   Schedule regular health, dental, and eye exams.  Stay current with your immunizations.   Do not use any tobacco products including cigarettes, chewing tobacco, or electronic cigarettes.  If you are pregnant, do not drink alcohol.  If you are breastfeeding, limit how much and how often you drink alcohol.  Limit alcohol intake to no more than 1 drink per day for nonpregnant women. One drink equals 12 ounces of beer, 5 ounces of wine, or 1 ounces of hard liquor.  Do not use street drugs.  Do not share needles.  Ask your health care provider for help if  you need support or information about quitting drugs.  Tell your health care provider if you often feel depressed.  Tell your health care provider if you have ever been abused or do not feel safe at home.   This information is not intended to replace advice given to you by your health care provider. Make sure you discuss any questions you have with your health care provider.   Document Released: 09/13/2010 Document Revised: 03/21/2014 Document Reviewed: 01/30/2013 Elsevier Interactive Patient Education [employer].

## 2015-11-14 LAB — LIPID PANEL
CHOL/HDL RATIO: 2.2 ratio (ref <=5.0)
Cholesterol: 163 mg/dL (ref 125–200)
HDL: 73 mg/dL (ref >=46)
LDL CALC: 76 mg/dL (ref <130)
TRIGLYCERIDES: 68 mg/dL (ref <150)
VLDL: 14 mg/dL (ref <30)

## 2015-11-14 LAB — COMPLETE METABOLIC PANEL WITH GFR
ALT: 8 U/L (ref 6–29)
AST: 18 U/L (ref 10–30)
Albumin: 4.4 g/dL (ref 3.6–5.1)
Alkaline Phosphatase: 29 U/L — ABNORMAL LOW (ref 33–115)
BUN: 9 mg/dL (ref 7–25)
CO2: 26 mmol/L (ref 20–31)
Calcium: 8.9 mg/dL (ref 8.6–10.2)
Chloride: 105 mmol/L (ref 98–110)
Creat: 0.86 mg/dL (ref 0.50–1.10)
GFR, Est African American: >89 mL/min (ref >=60)
GLUCOSE: 80 mg/dL (ref 65–99)
POTASSIUM: 4 mmol/L (ref 3.5–5.3)
SODIUM: 138 mmol/L (ref 135–146)
Total Bilirubin: 0.3 mg/dL (ref 0.2–1.2)
Total Protein: 6.7 g/dL (ref 6.1–8.1)

## 2015-11-17 LAB — QUANTIFERON TB GOLD ASSAY (BLOOD)
INTERFERON GAMMA RELEASE ASSAY: NEGATIVE
QUANTIFERON TB AG MINUS NIL: 0 [IU]/mL
Quantiferon Nil Value: 0.05 IU/mL

## 2017-03-03 ENCOUNTER — Ambulatory Visit: Payer: BC Managed Care – PPO | Admitting: Nurse Practitioner

## 2017-03-03 ENCOUNTER — Encounter: Payer: Self-pay | Admitting: Nurse Practitioner

## 2017-03-03 ENCOUNTER — Other Ambulatory Visit: Payer: Self-pay

## 2017-03-03 VITALS — BP 113/68 | HR 62 | Temp 97.7°F | Ht 66.0 in | Wt 176.8 lb

## 2017-03-03 DIAGNOSIS — J019 Acute sinusitis, unspecified: Secondary | ICD-10-CM

## 2017-03-03 DIAGNOSIS — B3731 Acute candidiasis of vulva and vagina: Secondary | ICD-10-CM

## 2017-03-03 DIAGNOSIS — B373 Candidiasis of vulva and vagina: Secondary | ICD-10-CM

## 2017-03-03 DIAGNOSIS — B9689 Other specified bacterial agents as the cause of diseases classified elsewhere: Secondary | ICD-10-CM

## 2017-03-03 MED ORDER — FLUCONAZOLE 150 MG PO TABS: 150.0000 mg | ORAL_TABLET | Freq: Once | ORAL | 0 refills | Status: AC

## 2017-03-03 MED ORDER — AMOXICILLIN 500 MG PO TABS: 500.0000 mg | ORAL_TABLET | Freq: Two times a day (BID) | ORAL | 0 refills | Status: AC

## 2017-03-03 MED ORDER — IPRATROPIUM BROMIDE 0.06 % NA SOLN: 2.0000 | Freq: Four times a day (QID) | NASAL | 0 refills | Status: DC

## 2017-03-03 NOTE — Progress Notes (Signed)
Subjective:    Patient ID: Heidi CanaryLindsay D Asano, female    DOB: 1985-05-11, 31 y.o.   MRN: [id number]  Heidi Bonilla is a 31 y.o. female presenting on 03/03/2017 for Cough (productive cough w/ light greenish mucus, facial pressure, nasal drainage, and headache x 1mth)   HPI URI Patient presents today with symptoms of URI times 3 weeks.  Symptoms include nasal congestion, rhinorrhea, sinus congestion, sinus pressure, headache, cough. She did have improvement about 10 days after onset of symptoms, but has had worsening over the last 7 days.   - Pt denies fever, chills, sweats, nausea, vomiting, diarrhea and constipation. - Has been taking OTC mucinex and mucinex DM with some relief of symptoms and continuous relief of congestion.  Despite continued use of OTC beds, she reports no movement of secretions in last 1 week. 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  - Works in school with multiple sick contacts.  Social History   Tobacco Use  . Smoking status: Never Smoker  . Smokeless tobacco: Never Used  Substance Use Topics  . Alcohol use: No  . Drug use: No    Review of Systems Per HPI unless specifically indicated above     Objective:    BP 113/68 (BP Location: Right Arm, Patient Position: Sitting, Cuff Size: Normal)   Pulse 62   Temp 97.7 F (36.5 C) (Oral)   Ht 5\' 6"  (1.676 m)   Wt 176 lb 12.8 oz (80.2 kg)   SpO2 100%   BMI 28.54 kg/m   Wt Readings from Last 3 Encounters:  03/03/17 176 lb 12.8 oz (80.2 kg)  11/13/15 171 lb (77.6 kg)  02/09/15 178 lb (80.7 kg)    Physical Exam  General - overweight, well-appearing, NAD HEENT - Normocephalic, atraumatic, PERRL, EOMI, patent nares w/ edema and rhonirrhea, oropharynx clear, MMM, TM normal, Ear canal normal without lesions, external ear normal w/o lesions, mild maxillary sinus tenderness  Neck - supple, non-tender, no cervical  LAD Heart - RRR, no murmurs heard Lungs - Clear throughout all lobes, no wheezing, crackles, or rhonchi. Normal work of breathing. Extremeties - non-tender, no edema, cap refill < 2 seconds, peripheral pulses intact +2 bilaterally Skin - warm, dry, no rashes Neuro - awake, alert, oriented x3, normal gait Psych - Normal mood and affect, normal behavior      Assessment & Plan:   Problem List Items Addressed This Visit    None    Visit Diagnoses    Acute bacterial rhinosinusitis    -  Primary Consistent with URI and secondary sinusitis with symptoms worsening over the past 7 days and initial symptoms of nasal congestion and sinus pressure over 2 weeks ago.   Plan: 1.START taking amoxicillin 500 mg tablets every 12 hours for 10 days.  Discussed completing antibiotic. - While on antibiotic, take a probiotic OTC or from food. - Start Atrovent nasal spray decongestant 2 sprays each nostril up to 4 times daily for 5-7 days - Continue anti-histamine cetirizine 10mg  daily. - Can use Flonase 2 sprays each nostril daily for up to 4-6 weeks if no epistaxis. - Start Mucinex-DM OTC for  7-10 days prn congestion 2. Supportive care with nasal saline, warm herbal tea with honey, 3. Improve hydration 4. Tylenol / Motrin PRN fevers  5. Return criteria given   Relevant Medications   ipratropium (ATROVENT) 0.06 % nasal spray  amoxicillin (AMOXIL) 500 MG tablet   fluconazole (DIFLUCAN) 150 MG tablet   Vaginal candidiasis     Pt reports frequently having vaginal candidiasis w/ antibiotic use.    Plan: 1. Take one fluconazole tablet once at end of antibiotic course if having increased thick, white vaginal discharge. 2. Follow-up as needed.   Relevant Medications   fluconazole (DIFLUCAN) 150 MG tablet      Meds ordered this encounter  Medications  . ipratropium (ATROVENT) 0.06 % nasal spray    Sig: Place 2 sprays into both nostrils 4 (four) times daily for 7 days.    Dispense:  15 mL     Refill:  0    Order Specific Question:   Supervising Provider    Answer:   Smitty CordsKARAMALEGOS, ALEXANDER J [[id number]]  . amoxicillin (AMOXIL) 500 MG tablet    Sig: Take 1 tablet (500 mg total) by mouth 2 (two) times daily for 10 days.    Dispense:  20 tablet    Refill:  0    Order Specific Question:   Supervising Provider    Answer:   Smitty CordsKARAMALEGOS, ALEXANDER J [[id number]]  . fluconazole (DIFLUCAN) 150 MG tablet    Sig: Take 1 tablet (150 mg total) by mouth once for 1 dose.    Dispense:  1 tablet    Refill:  0    Order Specific Question:   Supervising Provider    Answer:   Smitty CordsKARAMALEGOS, ALEXANDER J [[id number]]      Follow up plan: Return 7-10 days if symptoms worsen or fail to improve.  Wilhelmina McardleLauren Riah Kehoe, DNP, AGPCNP-BC Adult Gerontology Primary Care Nurse Practitioner [facility] 03/03/2017, 4:20 PM

## 2017-03-03 NOTE — Patient Instructions (Addendum)
Heidi Bonilla, Thank you for coming in to clinic today.  1. It sounds like you have an Upper Respiratory Bacterial infection.  Recommend good hand washing. - START taking amoxicillin 500 mg twice daily for 10 days.  Make sure to take all doses of your antibiotic. - While you are on an antibiotic, take a probiotic.  Antibiotics kill good and bad bacteria.  A probiotic helps to replace your good bacteria. Probiotic pills can be found over the counter.  One brand is Florastor, but you can use any brand you prefer.  You can also get good bacteria from foods like yogurt. - Start Atrovent nasal spray decongestant 2 sprays each nostril up to 4 times daily for 5-7 days - Start cetirizine 10mg  daily for 2-4 weeks. - You may also use Flonase 2 sprays each nostril daily for up to 4-6 weeks (optional)  Other over the counter medications you may try, if needed for symptoms are: - If congestion is worse, start OTC Mucinex (or may try Mucinex-DM for cough) up to 7-10 days then stop - You may try over the counter Nasal Saline spray (Simply Saline, Ocean Spray) as needed to reduce congestion. - Start taking Tylenol extra strength 1 to 2 tablets every 6-8 hours for aches or fever/chills for next few days as needed.  Do not take more than 3,000 mg in 24 hours from all medicines.  You may also take ibuprofen 200-400mg  every 8 hours as needed.   - Drink warm herbal tea with honey for sore throat.   If symptoms are significantly worse with persistent fevers/chills despite tylenol/ibpurofen, nausea, vomiting unable to tolerate food/fluids or medicine, body aches, or shortness of breath, sinus pain pressure or worsening productive cough, then follow-up for re-evaluation, may seek more immediate care at Urgent Care or the ED if you are more concerned that it is an emergency.   Please schedule a follow-up appointment with Wilhelmina McardleLauren Alexandria Bonilla, AGNP. Return 7-10 days if symptoms worsen or fail to improve.  If you have any other  questions or concerns, please feel free to call the clinic or send a message through MyChart. You may also schedule an earlier appointment if necessary.  You will receive a survey after today's visit either digitally by e-mail or paper by [employer] mail. Your experiences and feedback matter to us.  Please respond so we know how we are doing as we provide care for you.   Wilhelmina McardleLauren Shanasia Ibrahim, DNP, AGNP-BC Adult Gerontology Nurse Practitioner [facility], [facility]

## 2017-03-10 ENCOUNTER — Other Ambulatory Visit: Payer: Self-pay

## 2017-03-21 ENCOUNTER — Telehealth: Payer: Self-pay | Admitting: Family Medicine

## 2017-03-21 DIAGNOSIS — J0140 Acute pansinusitis, unspecified: Secondary | ICD-10-CM

## 2017-03-21 MED ORDER — AMOXICILLIN-POT CLAVULANATE ER 1000-62.5 MG PO TB12: 2.0000 | ORAL_TABLET | Freq: Two times a day (BID) | ORAL | 0 refills | Status: AC

## 2017-03-21 NOTE — Telephone Encounter (Signed)
Pt.called states that she was not any better requesting refill on  Amoxicillin called into  [facility]. Pt call back # [phone].

## 2017-06-08 ENCOUNTER — Ambulatory Visit: Payer: BC Managed Care – PPO | Admitting: Family Medicine

## 2017-06-08 ENCOUNTER — Encounter: Payer: Self-pay | Admitting: Family Medicine

## 2017-06-08 VITALS — BP 114/75 | HR 61 | Temp 97.9°F | Resp 16 | Ht 66.0 in | Wt 175.0 lb

## 2017-06-08 DIAGNOSIS — R110 Nausea: Secondary | ICD-10-CM

## 2017-06-08 DIAGNOSIS — R519 Headache, unspecified: Secondary | ICD-10-CM

## 2017-06-08 DIAGNOSIS — N3001 Acute cystitis with hematuria: Secondary | ICD-10-CM | POA: Diagnosis not present

## 2017-06-08 DIAGNOSIS — R51 Headache: Secondary | ICD-10-CM

## 2017-06-08 DIAGNOSIS — R109 Unspecified abdominal pain: Secondary | ICD-10-CM

## 2017-06-08 DIAGNOSIS — N3946 Mixed incontinence: Secondary | ICD-10-CM

## 2017-06-08 DIAGNOSIS — R350 Frequency of micturition: Secondary | ICD-10-CM

## 2017-06-08 DIAGNOSIS — F430 Acute stress reaction: Secondary | ICD-10-CM | POA: Diagnosis not present

## 2017-06-08 LAB — POCT URINALYSIS DIPSTICK
BILIRUBIN UA: NEGATIVE
Glucose, UA: NEGATIVE
KETONES UA: NEGATIVE
Leukocytes, UA: NEGATIVE
NITRITE UA: NEGATIVE
PH UA: 5 (ref 5.0–8.0)
PROTEIN UA: NEGATIVE
Spec Grav, UA: 1.01 (ref 1.010–1.025)
UROBILINOGEN UA: 0.2 U/dL

## 2017-06-08 MED ORDER — CIPROFLOXACIN HCL 500 MG PO TABS: 500.0000 mg | ORAL_TABLET | Freq: Two times a day (BID) | ORAL | 0 refills | Status: DC

## 2017-06-08 MED ORDER — ONDANSETRON 4 MG PO TBDP: 4.0000 mg | ORAL_TABLET | Freq: Three times a day (TID) | ORAL | 0 refills | Status: DC | PRN

## 2017-06-08 NOTE — Patient Instructions (Addendum)
Thank you for coming to the office today.  Will send out urine for culture to confirm  For now take Cipro 500mg  twice daily for 7 days - this can cover abdomen as well  Reviewed results, sugar is fine, kidneys are fine  No UTI based on prior urine, however it is still questionable, especially given symptoms  Headaches most likely stress - may try Tylenol  Recommend to start taking Tylenol Extra Strength 500mg  tabs - take 1 to 2 tabs per dose (max 1000mg ) every 6-8 hours for pain (take regularly, don't skip a dose for next 7 days), max 24 hour daily dose is 6 tablets or 3000mg . In the future you can repeat the same everyday Tylenol course for 1-2 weeks at a time.   Follow-up with GYN as scheduled - review the Vesciare medicine - can consider referral to Pelvic Floor Rehab from them or Urology  Stay tuned for Urology apt - they will call you, if not heard in a week, call them  [facility] [address] [place], [place] [address] Ph: [phone]  Dr Assunta GamblesBrian Cope    Please schedule a Follow-up Appointment to: Return if symptoms worsen or fail to improve, for UTI, bladder symptoms.  If you have any other questions or concerns, please feel free to call the office or send a message through MyChart. You may also schedule an earlier appointment if necessary.  Additionally, you may be receiving a survey about your experience at our office within a few days to 1 week by e-mail or mail. We value your feedback.  Saralyn PilarAlexander Arvid Marengo, DO [facility]

## 2017-06-08 NOTE — Progress Notes (Signed)
Subjective:    Patient ID: Heidi Bonilla, female    DOB: 09/02/1985, 32 y.o.   MRN: [id number]  Heidi Bonilla is a 32 y.o. female presenting on 06/08/2017 for Urinary Tract Infection (onset weeks to month seen gyn last week Rx Vesicare) and Nausea (cold,chills also abdominal pain, HA as per pt thinks might be glasses has some blurry vision)  Patient presents for a same day appointment. Previously established with prior PCP Amy Krebs, and she has not been back to our office in >1 year. She is new patient to me.  HPI    Possible UTI vs OAB / ABDOMINAL PAIN / CHILLS / NAUSEA Recent history saw [facility] Dr Feliberto Gottron on 05/30/17, see note for background, she was concern with recently worse with urgency and will have leakage and incontinence, cannot hold bladder. History of difficulty with bladder control and urinary frequency, severe pain in lower mid abdomen last night and some pressure. Associated with cramping pains and pressure, she had urinalysis test and blood work, showed normal Cr and Glucose, UA was questionable but did not seem consistent with UTI. Urine culture collected negative. Started on Vesicare 10mg  daily for OAB. - Today reports symptoms not improving on medicine. Has some days good other days worse. Recently had some chills last night and recent nausea and feverish, yesterday some headaches frontal bilateral behind both eyes, not throbbing, no history of headaches or migraines, 2-3 week worsening symptoms, has apt at [facility] in [place] later today - Additional history, S/p 2 pregnancies, since last child, she has had issues with bladder control - She returns to GYN next apt in 2 weeks  She is on OCP - Has not seen Urologist - but she prefers to go to [facility], requesting referral - Admits acute stress recently with multiple factors thinks this may be related to her headache, tried Excedrin migraine and Ibuprofen 600mg  multiple times without  significant improvement Denies vision loss, sinus pain or pressure, congestion, URI cough muscle aches, myalgias, active abdominal pain, hematuria, dysuria  Depression screen [facility] 2/9 11/13/2015  Decreased Interest 0  Down, Depressed, Hopeless 0  PHQ - 2 Score 0    Social History   Tobacco Use  . Smoking status: Never Smoker  . Smokeless tobacco: Never Used  Substance Use Topics  . Alcohol use: Yes  . Drug use: No    Review of Systems Per HPI unless specifically indicated above     Objective:    BP 114/75   Pulse 61   Temp 97.9 F (36.6 C) (Oral)   Resp 16   Ht 5\' 6"  (1.676 m)   Wt 175 lb (79.4 kg)   SpO2 100%   BMI 28.25 kg/m   Wt Readings from Last 3 Encounters:  06/08/17 175 lb (79.4 kg)  03/03/17 176 lb 12.8 oz (80.2 kg)  11/13/15 171 lb (77.6 kg)    Physical Exam  Constitutional: She is oriented to person, place, and time. She appears well-developed and well-nourished. No distress.  Well-appearing, comfortable, cooperative  HENT:  Head: Normocephalic and atraumatic.  Mouth/Throat: Oropharynx is clear and moist.  Eyes: Conjunctivae are normal. Right eye exhibits no discharge. Left eye exhibits no discharge.  Neck: Normal range of motion. Neck supple.  Cardiovascular: Normal rate, regular rhythm, normal heart sounds and intact distal pulses.  No murmur heard. Pulmonary/Chest: Effort normal and breath sounds normal. No respiratory distress. She has no wheezes. She has no rales.  Abdominal: Soft. Bowel  sounds are normal. She exhibits no distension and no mass. There is no tenderness. There is no rebound.  No RUQ murphy's sign  Musculoskeletal: Normal range of motion. She exhibits no edema.  No CVAT or flank pain  Lymphadenopathy:    She has no cervical adenopathy.  Neurological: She is alert and oriented to person, place, and time.  Skin: Skin is warm and dry. No rash noted. She is not diaphoretic. No erythema.  Psychiatric: Her behavior is normal.  Well  groomed, good eye contact, normal speech and thoughts  Nursing note and vitals reviewed.  Results for orders placed or performed in visit on 06/08/17  POCT Urinalysis Dipstick  Result Value Ref Range   Color, UA amber    Clarity, UA clear    Glucose, UA negative    Bilirubin, UA negative    Ketones, UA negative    Spec Grav, UA 1.010 1.010 - 1.025   Blood, UA trace    pH, UA 5.0 5.0 - 8.0   Protein, UA negative    Urobilinogen, UA 0.2 0.2 or 1.0 E.U./dL   Nitrite, UA negative    Leukocytes, UA Negative Negative   Appearance clear    Odor none       Assessment & Plan:   Problem List Items Addressed This Visit    None    Visit Diagnoses    Urinary frequency    -  Primary   Relevant Medications   ciprofloxacin (CIPRO) 500 MG tablet   Other Relevant Orders   Urine Culture   Ambulatory referral to Urology   Abdominal pain, unspecified abdominal location       Relevant Medications   ciprofloxacin (CIPRO) 500 MG tablet   Other Relevant Orders   POCT Urinalysis Dipstick (Completed)   Mixed stress and urge urinary incontinence       Relevant Medications   VESICARE 10 MG tablet   Other Relevant Orders   Urine Culture   Ambulatory referral to Urology   Acute nonintractable headache, unspecified headache type       Acute stress reaction       Acute cystitis with hematuria       Relevant Medications   ciprofloxacin (CIPRO) 500 MG tablet   Nausea       Relevant Medications   ondansetron (ZOFRAN ODT) 4 MG disintegrating tablet      Uncertain exact etiology of her constellation of symptoms. Clinically seems most consistent with UTI possibly with urinary retention, has just seen GYN on OCP but limited result so far on anti-spasmodic agent. Urine Cx was negative >1 week ago. No URI or other source of infection. Does not seem consistent with Flu. No signs or symptom for nephrolithiasis. - Check UA and Urine Culture - Start empiric therapy with Cipro 500mg  BID x 7 days, will  cover empiric potential GI source as well - Rx Zofran ODT PRN nausea - Strict return criteria given if worsening may need to go to hospital ED or require further imaging advanced for abdomen/pelvis  Regarding OAB and mixed urinary incontinence symptoms >1 year now, concerning. Patient requested referral to Urology - [facility] for eval with urodynamics bladder scan and further management. May need pelvic floor rehab.   Meds ordered this encounter  Medications  . ciprofloxacin (CIPRO) 500 MG tablet    Sig: Take 1 tablet (500 mg total) by mouth 2 (two) times daily.    Dispense:  14 tablet  Refill:  0  . ondansetron (ZOFRAN ODT) 4 MG disintegrating tablet    Sig: Take 1 tablet (4 mg total) by mouth every 8 (eight) hours as needed for nausea or vomiting.    Dispense:  30 tablet    Refill:  0      Follow up plan: Return if symptoms worsen or fail to improve, for UTI, bladder symptoms.  Saralyn Pilar, DO [facility] 06/08/2017, 8:02 PM

## 2017-06-09 LAB — URINE CULTURE
MICRO NUMBER: 90390075
Result:: NO GROWTH
SPECIMEN QUALITY:: ADEQUATE

## 2017-10-26 ENCOUNTER — Ambulatory Visit (INDEPENDENT_AMBULATORY_CARE_PROVIDER_SITE_OTHER): Payer: BC Managed Care – PPO | Admitting: Nurse Practitioner

## 2017-10-26 ENCOUNTER — Encounter: Payer: Self-pay | Admitting: Nurse Practitioner

## 2017-10-26 ENCOUNTER — Other Ambulatory Visit: Payer: Self-pay

## 2017-10-26 VITALS — BP 128/61 | HR 56 | Temp 98.2°F | Ht 66.0 in | Wt 177.4 lb

## 2017-10-26 DIAGNOSIS — N3090 Cystitis, unspecified without hematuria: Secondary | ICD-10-CM | POA: Diagnosis not present

## 2017-10-26 DIAGNOSIS — R300 Dysuria: Secondary | ICD-10-CM | POA: Diagnosis not present

## 2017-10-26 LAB — POCT URINALYSIS DIPSTICK
Bilirubin, UA: NEGATIVE
Glucose, UA: NEGATIVE
Ketones, UA: NEGATIVE
Nitrite, UA: NEGATIVE
Protein, UA: NEGATIVE
Spec Grav, UA: 1.025 (ref 1.010–1.025)
Urobilinogen, UA: 0.2 E.U./dL
pH, UA: 5 (ref 5.0–8.0)

## 2017-10-26 MED ORDER — CEPHALEXIN 500 MG PO CAPS: 500.0000 mg | ORAL_CAPSULE | Freq: Three times a day (TID) | ORAL | 0 refills | Status: AC

## 2017-10-26 MED ORDER — PHENAZOPYRIDINE HCL 200 MG PO TABS: 200.0000 mg | ORAL_TABLET | Freq: Three times a day (TID) | ORAL | 0 refills | Status: AC | PRN

## 2017-10-26 NOTE — Patient Instructions (Addendum)
Heidi Bonilla,   Thank you for coming in to clinic today.  1.  START Keflex 500mg  3 times daily for next 5 days.   2. START pyridium 200 mg one tablet up to 3 times daily for pelvic pain.  Continue good urinary hygiene.   Please schedule a follow-up appointment with [name], AGNP. Return if symptoms worsen or fail to improve.  If you have any other questions or concerns, please feel free to call the clinic or send a message through MyChart. You may also schedule an earlier appointment if necessary.  You will receive a survey after today's visit either digitally by e-mail or paper by [employer] mail. Your experiences and feedback matter to us.  Please respond so we know how we are doing as we provide care for you.   Wilhelmina McardleLauren [name], DNP, AGNP-BC Adult Gerontology Nurse Practitioner [facility], [facility]   Urinary Tract Infection, Adult A urinary tract infection (UTI) is an infection of any part of the urinary tract. The urinary tract includes the:  Kidneys.  Ureters.  Bladder.  Urethra.  These organs make, store, and get rid of pee (urine) in the body. Follow these instructions at home:  Take over-the-counter and prescription medicines only as told by your doctor.  If you were prescribed an antibiotic medicine, take it as told by your doctor. Do not stop taking the antibiotic even if you start to feel better.  Avoid the following drinks: ? Alcohol. ? Caffeine. ? Tea. ? Carbonated drinks.  Drink enough fluid to keep your pee clear or pale yellow.  Keep all follow-up visits as told by your doctor. This is important.  Make sure to: ? Empty your bladder often and completely. Do not to hold pee for long periods of time. ? Empty your bladder before and after sex. ? Wipe from front to back after a bowel movement if you are female. Use each tissue one time when you wipe. Contact a doctor if:  You have back pain.  You have a fever.  You feel sick to  your stomach (nauseous).  You throw up (vomit).  Your symptoms do not get better after 3 days.  Your symptoms go away and then come back. Get help right away if:  You have very bad back pain.  You have very bad lower belly (abdominal) pain.  You are throwing up and cannot keep down any medicines or water. This information is not intended to replace advice given to you by your health care provider. Make sure you discuss any questions you have with your health care provider. Document Released: 08/17/2007 Document Revised: 08/06/2015 Document Reviewed: 01/19/2015 Elsevier Interactive Patient Education  [employer]2018 Elsevier Inc.

## 2017-10-26 NOTE — Progress Notes (Signed)
Subjective:    Patient ID: Heidi Bonilla, female    DOB: October 29, 1985, 32 y.o.   MRN: [id number]  Heidi Bonilla is a 32 y.o. female presenting on 10/26/2017 for Dysuria (urinary urgency, urinary frequency, blood with wiping x 2 days )   HPI UTI symptoms Patient presents with urinary urgency, frequency, small voids x 2 days.Tuesday evening started have sharp pain/burning after urination.  Felt urge when lying down without urine.   Was unable to get care yesterday at urgent care or here as schedules were full.  Urge incontinence this am.  Difficult to sleep related to pelvic pain/pressure/constant urge.  Has seen trace of blood with wiping.  Sharp lower R back pain, brief.  No other back or flank pain.   Pt denies fever, chills, sweats, nausea, vomiting, diarrhea and constipation. Has not had recent antibiotic use.  No recent UTI.  Social History   Tobacco Use  . Smoking status: Never Smoker  . Smokeless tobacco: Never Used  Substance Use Topics  . Alcohol use: Yes  . Drug use: No    Review of Systems Per HPI unless specifically indicated above     Objective:    BP 128/61 (BP Location: Left Arm, Patient Position: Sitting, Cuff Size: Normal)   Pulse (!) 56   Temp 98.2 F (36.8 C) (Oral)   Ht 5\' 6"  (1.676 m)   Wt 177 lb 6.4 oz (80.5 kg)   LMP 10/07/2017   BMI 28.63 kg/m   Wt Readings from Last 3 Encounters:  10/26/17 177 lb 6.4 oz (80.5 kg)  06/08/17 175 lb (79.4 kg)  03/03/17 176 lb 12.8 oz (80.2 kg)    Physical Exam  Constitutional: She is oriented to person, place, and time. She appears well-developed and well-nourished. No distress.  HENT:  Head: Normocephalic and atraumatic.  Cardiovascular: Normal rate, regular rhythm, S1 normal, S2 normal, normal heart sounds and intact distal pulses.  Pulmonary/Chest: Effort normal and breath sounds normal. No respiratory distress.  Abdominal: Soft. Normal appearance. There is no hepatosplenomegaly. There is tenderness in  the suprapubic area and left lower quadrant. There is no rigidity, no rebound, no guarding, no CVA tenderness, no tenderness at McBurney's point and negative Murphy's sign.  Neurological: She is alert and oriented to person, place, and time.  Skin: Skin is warm and dry. Capillary refill takes less than 2 seconds.  Psychiatric: She has a normal mood and affect. Her behavior is normal. Judgment and thought content normal.  Vitals reviewed.  Results for orders placed or performed in visit on 10/26/17  POCT Urinalysis Dipstick  Result Value Ref Range   Color, UA yellow    Clarity, UA clear    Glucose, UA Negative Negative   Bilirubin, UA neg    Ketones, UA neg    Spec Grav, UA 1.025 1.010 - 1.025   Blood, UA large    pH, UA 5.0 5.0 - 8.0   Protein, UA Negative Negative   Urobilinogen, UA 0.2 0.2 or 1.0 E.U./dL   Nitrite, UA negative    Leukocytes, UA Large (3+) (A) Negative   Appearance     Odor        Assessment & Plan:   Problem List Items Addressed This Visit    None    Visit Diagnoses    Dysuria    -  Primary   Relevant Medications   cephALEXin (KEFLEX) 500 MG capsule   phenazopyridine (PYRIDIUM) 200 MG tablet   Other Relevant  Orders   POCT Urinalysis Dipstick (Completed)   Cystitis       Relevant Medications   cephALEXin (KEFLEX) 500 MG capsule      Acute cystitis with hematuria.  Pt symptomatic currently with increased suprapubic pressure x 2 days. Currently without systemic signs or symptoms of infection.   - No current risk of concurrent STI.  Plan: 1. START Keflex 500mg  3 times daily for next 5 days.   - Send Urine culture  - START pyridium 200 mg one tablet three times daily prn urinary pain. 2. Provided non-pharm measures for UTI prevention for good hygiene. 3. Drink plenty of fluids and improve hydration over next 1 week. 4. Provided precautions for severe symptoms requiring ED visit to include no urine in 24-48 hours. 5. Followup 2-5 days as needed for  worsening or persistent symptoms.    Meds ordered this encounter  Medications  . cephALEXin (KEFLEX) 500 MG capsule    Sig: Take 1 capsule (500 mg total) by mouth 3 (three) times daily for 5 days.    Dispense:  15 capsule    Refill:  0    Order Specific Question:   Supervising Provider    Answer:   Smitty CordsKARAMALEGOS, ALEXANDER J [[id number]]  . phenazopyridine (PYRIDIUM) 200 MG tablet    Sig: Take 1 tablet (200 mg total) by mouth 3 (three) times daily as needed for up to 3 days for pain.    Dispense:  9 tablet    Refill:  0    Order Specific Question:   Supervising Provider    Answer:   Smitty CordsKARAMALEGOS, ALEXANDER J [[id number]]    Follow up plan: Return if symptoms worsen or fail to improve.  Wilhelmina McardleLauren Anshika Pethtel, DNP, AGPCNP-BC Adult Gerontology Primary Care Nurse Practitioner [facility] 10/26/2017, 8:29 AM

## 2017-10-29 LAB — URINE CULTURE
MICRO NUMBER:: 90974458
SPECIMEN QUALITY:: ADEQUATE

## 2018-04-24 ENCOUNTER — Other Ambulatory Visit: Payer: Self-pay

## 2018-04-24 ENCOUNTER — Encounter: Payer: Self-pay | Admitting: Nurse Practitioner

## 2018-04-24 ENCOUNTER — Ambulatory Visit: Payer: BC Managed Care – PPO | Admitting: Nurse Practitioner

## 2018-04-24 VITALS — BP 116/62 | HR 52 | Temp 98.3°F | Ht 66.0 in | Wt 183.0 lb

## 2018-04-24 DIAGNOSIS — R590 Localized enlarged lymph nodes: Secondary | ICD-10-CM

## 2018-04-24 DIAGNOSIS — B350 Tinea barbae and tinea capitis: Secondary | ICD-10-CM

## 2018-04-24 MED ORDER — PREDNISONE 10 MG PO TABS: ORAL_TABLET | ORAL | 0 refills | Status: DC

## 2018-04-24 NOTE — Progress Notes (Signed)
Subjective:    Patient ID: Heidi Bonilla, female    DOB: 15-Oct-1985, 33 y.o.   MRN: [id number]  Heidi Bonilla is a 33 y.o. female presenting on 04/24/2018 for Ear Pain (left ear pain mostly on the ear lobe. The pt states the pain is now radiating down the neck x 3 days. She describe it as a throbbing pain. )   HPI Ear Pain Patient presents today with left sided ear pain at ear lobe that radiates down her neck.  Started 3 days ago, progressively worsening today is now affecting lymph nodes with throbbing/warmth/tenderness. - No cold symptoms, sinus pressure, cough, congestion, fever, chills, or sweats, but has had headaches.  Denies any changes to hearing. - Patient notes both of her kids last week had Strep throat.  Social History   Tobacco Use  . Smoking status: Never Smoker  . Smokeless tobacco: Never Used  Substance Use Topics  . Alcohol use: Yes  . Drug use: No    Review of Systems Per HPI unless specifically indicated above     Objective:    BP 116/62 (BP Location: Left Arm, Patient Position: Sitting, Cuff Size: Normal)   Pulse (!) 52   Temp 98.3 F (36.8 C) (Oral)   Ht 5\' 6"  (1.676 m)   Wt 183 lb (83 kg)   BMI 29.54 kg/m   Wt Readings from Last 3 Encounters:  04/24/18 183 lb (83 kg)  10/26/17 177 lb 6.4 oz (80.5 kg)  06/08/17 175 lb (79.4 kg)    Physical Exam Vitals signs reviewed.  Constitutional:      General: She is not in acute distress.    Appearance: She is well-developed.  HENT:     Head: Normocephalic and atraumatic.     Right Ear: Hearing, tympanic membrane, ear canal and external ear normal. No mastoid tenderness.     Left Ear: Hearing, tympanic membrane and ear canal normal. No mastoid tenderness.     Ears:      Nose: Nose normal.     Right Sinus: No maxillary sinus tenderness or frontal sinus tenderness.     Left Sinus: No maxillary sinus tenderness or frontal sinus tenderness.     Mouth/Throat:     Lips: Pink.     Mouth: Mucous  membranes are moist.     Pharynx: Oropharynx is clear.     Tonsils: Swelling: 0 on the right. 0 on the left.  Cardiovascular:     Rate and Rhythm: Normal rate and regular rhythm.     Pulses:          Radial pulses are 2+ on the right side and 2+ on the left side.       Posterior tibial pulses are 1+ on the right side and 1+ on the left side.     Heart sounds: Normal heart sounds, S1 normal and S2 normal.  Pulmonary:     Effort: Pulmonary effort is normal. No respiratory distress.     Breath sounds: Normal breath sounds and air entry.  Musculoskeletal:     Right lower leg: No edema.     Left lower leg: No edema.  Lymphadenopathy:     Head:     Left side of head: Preauricular adenopathy present. No submental, submandibular, tonsillar, posterior auricular or occipital adenopathy.     Cervical: Cervical adenopathy present.     Right cervical: No superficial, deep or posterior cervical adenopathy.    Left cervical: Superficial cervical adenopathy present. No  deep or posterior cervical adenopathy.  Skin:    General: Skin is warm and dry.     Capillary Refill: Capillary refill takes less than 2 seconds.  Neurological:     General: No focal deficit present.     Mental Status: She is alert and oriented to person, place, and time. Mental status is at baseline.  Psychiatric:        Attention and Perception: Attention normal.        Mood and Affect: Mood and affect normal.        Behavior: Behavior normal. Behavior is cooperative.        Thought Content: Thought content normal.        Judgment: Judgment normal.     Results for orders placed or performed in visit on 10/26/17  Urine Culture  Result Value Ref Range   MICRO NUMBER: [id number]    SPECIMEN QUALITY: ADEQUATE    Sample Source URINE    STATUS: FINAL    ISOLATE 1: Escherichia coli (A)       Susceptibility   Escherichia coli - URINE CULTURE, REFLEX    AMOX/CLAVULANIC 4 Sensitive     AMPICILLIN >=32 Resistant      AMPICILLIN/SULBACTAM 16 Intermediate     CEFAZOLIN* <=4 Not Reportable      * For infections other than uncomplicated UTIcaused by E. coli, K. pneumoniae or P. mirabilis:Cefazolin is resistant if MIC > or = 8 mcg/mL.(Distinguishing susceptible versus intermediatefor isolates with MIC < or = 4 mcg/mL requiresadditional testing.)For uncomplicated UTI caused by E. coli,K. pneumoniae or P. mirabilis: Cefazolin issusceptible if MIC <32 mcg/mL and predictssusceptible to the oral agents cefaclor, cefdinir,cefpodoxime, cefprozil, cefuroxime, cephalexinand loracarbef.    CEFEPIME <=1 Sensitive     CEFTRIAXONE <=1 Sensitive     CIPROFLOXACIN <=0.25 Sensitive     LEVOFLOXACIN <=0.12 Sensitive     ERTAPENEM <=0.5 Sensitive     GENTAMICIN <=1 Sensitive     IMIPENEM <=0.25 Sensitive     NITROFURANTOIN <=16 Sensitive     PIP/TAZO <=4 Sensitive     TOBRAMYCIN <=1 Sensitive     TRIMETH/SULFA* >=320 Resistant      * For infections other than uncomplicated UTIcaused by E. coli, K. pneumoniae or P. mirabilis:Cefazolin is resistant if MIC > or = 8 mcg/mL.(Distinguishing susceptible versus intermediatefor isolates with MIC < or = 4 mcg/mL requiresadditional testing.)For uncomplicated UTI caused by E. coli,K. pneumoniae or P. mirabilis: Cefazolin issusceptible if MIC <32 mcg/mL and predictssusceptible to the oral agents cefaclor, cefdinir,cefpodoxime, cefprozil, cefuroxime, cephalexinand loracarbef.Legend:S = Susceptible  I = IntermediateR = Resistant  NS = Not susceptible* = Not tested  NR = Not reported**NN = See antimicrobic comments  POCT Urinalysis Dipstick  Result Value Ref Range   Color, UA yellow    Clarity, UA clear    Glucose, UA Negative Negative   Bilirubin, UA neg    Ketones, UA neg    Spec Grav, UA 1.025 1.010 - 1.025   Blood, UA large    pH, UA 5.0 5.0 - 8.0   Protein, UA Negative Negative   Urobilinogen, UA 0.2 0.2 or 1.0 E.U./dL   Nitrite, UA negative    Leukocytes, UA Large (3+) (A) Negative     Appearance     Odor        Assessment & Plan:   Problem List Items Addressed This Visit    None    Visit Diagnoses    LAD (lymphadenopathy), preauricular    -  Primary   Relevant Medications   predniSONE (DELTASONE) 10 MG tablet   Tinea capitis       Relevant Medications   predniSONE (DELTASONE) 10 MG tablet     Skin erythema most suggestive of possible fungal skin infection with LAD.  Specific etiology of pain and rash remains unknown.   Plan: 1. START OTC topical antifungal bid x 7-10 days.  Call clinic if not improving 2. For LAD and pain, start 6-day prednisone taper. 3. Follow-up in clinic prn next 1-2 weeks.   Meds ordered this encounter  Medications  . predniSONE (DELTASONE) 10 MG tablet    Sig: Day 1 take 6 pills. Day 2 take 5 pills then reduce by 1 pill each day.    Dispense:  21 tablet    Refill:  0    Order Specific Question:   Supervising Provider    Answer:   Smitty CordsKARAMALEGOS, ALEXANDER J [[id number]]    Follow up plan: Return if symptoms worsen or fail to improve.  Wilhelmina McardleLauren Artavious Trebilcock, DNP, AGPCNP-BC Adult Gerontology Primary Care Nurse Practitioner [facility] 04/24/2018, 10:32 AM

## 2018-04-24 NOTE — Patient Instructions (Addendum)
Heidi Bonilla,   Thank you for coming in to clinic today.  1. START antifungal cream twice daily for 7-10 days. - If not improving in 3-5 days, call clinic.  2. Start prednisone 6 day taper. Take prednisone taper 10 mg tablets Day 1 (Today): Take 6 pills at one time Day 2: Take 5 pills Day 3: Take 4 pills Day 4: Take 3 pills Day 5: Take 2 pills Day 6: Take 1 pill then stop.  Please schedule a follow-up appointment with Wilhelmina Mcardle, AGNP. Return if symptoms worsen or fail to improve.  If you have any other questions or concerns, please feel free to call the clinic or send a message through MyChart. You may also schedule an earlier appointment if necessary.  You will receive a survey after today's visit either digitally by e-mail or paper by [employer]. Your experiences and feedback matter to [place].  Please respond so we know how we are doing as we provide care for you.   Wilhelmina Mcardle, DNP, AGNP-BC Adult Gerontology Nurse Practitioner [facility], [facility]

## 2018-04-27 ENCOUNTER — Encounter: Payer: Self-pay | Admitting: Nurse Practitioner

## 2018-09-20 DIAGNOSIS — O26843 Uterine size-date discrepancy, third trimester: Secondary | ICD-10-CM | POA: Insufficient documentation

## 2018-09-21 ENCOUNTER — Other Ambulatory Visit: Payer: Self-pay

## 2018-09-21 ENCOUNTER — Ambulatory Visit (INDEPENDENT_AMBULATORY_CARE_PROVIDER_SITE_OTHER): Payer: BC Managed Care – PPO

## 2018-09-21 ENCOUNTER — Encounter: Payer: Self-pay | Admitting: Podiatry

## 2018-09-21 ENCOUNTER — Ambulatory Visit: Payer: BC Managed Care – PPO | Admitting: Podiatry

## 2018-09-21 VITALS — Temp 97.6°F

## 2018-09-21 DIAGNOSIS — M722 Plantar fascial fibromatosis: Secondary | ICD-10-CM

## 2018-09-21 MED ORDER — METHYLPREDNISOLONE 4 MG PO TBPK: ORAL_TABLET | ORAL | 0 refills | Status: DC

## 2018-09-21 MED ORDER — MELOXICAM 15 MG PO TABS: 15.0000 mg | ORAL_TABLET | Freq: Every day | ORAL | 1 refills | Status: DC

## 2018-09-24 NOTE — Progress Notes (Signed)
   Subjective: 33 y.o. female presenting today with a chief complaint of intermittent sharp pain in the left heel that began about 5 months ago. She states the pain is worse in the morning and at the end of the day after being on the foot all day. Walking and running increases the pain. She has been using plantar fascial bands which helps alleviate the pain. She has also taken Ibuprofen, stretching, icing and applying BenGay with no significant relief. Patient is here for further evaluation and treatment.    Past Medical History:  Diagnosis Date  . Anemia      Objective: Physical Exam General: The patient is alert and oriented x3 in no acute distress.  Dermatology: Skin is warm, dry and supple bilateral lower extremities. Negative for open lesions or macerations bilateral.   Vascular: Dorsalis Pedis and Posterior Tibial pulses palpable bilateral.  Capillary fill time is immediate to all digits.  Neurological: Epicritic and protective threshold intact bilateral.   Musculoskeletal: Tenderness to palpation to the plantar aspect of the left heel along the plantar fascia. All other joints range of motion within normal limits bilateral. Strength 5/5 in all groups bilateral.   Radiographic exam: Normal osseous mineralization. Joint spaces preserved. No fracture/dislocation/boney destruction. No other soft tissue abnormalities or radiopaque foreign bodies.   Assessment: 1. Plantar fasciitis left foot  Plan of Care:  1. Patient evaluated. Xrays reviewed.   2. Injection of 0.5cc Celestone soluspan injected into the left plantar fascia.  3. Rx for Medrol Dose Pak placed 4. Rx for Meloxicam ordered for patient. 5. Plantar fascial band(s) dispensed  6. Instructed patient regarding therapies and modalities at home to alleviate symptoms.  7. Appointment with Liliane Channel, Pedorthist, for custom molded orthotics.  8. Return to clinic as needed.  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      Edrick Kins, DPM [facility]  Dr. Edrick Kins, DPM    [address], [place] [address]                Office [phone]  Fax [phone]

## 2018-10-03 ENCOUNTER — Other Ambulatory Visit: Payer: BC Managed Care – PPO | Admitting: Orthotics

## 2019-02-01 ENCOUNTER — Other Ambulatory Visit: Payer: Self-pay

## 2019-02-01 ENCOUNTER — Encounter: Payer: Self-pay | Admitting: Podiatry

## 2019-02-01 ENCOUNTER — Ambulatory Visit: Payer: BC Managed Care – PPO | Admitting: Podiatry

## 2019-02-01 DIAGNOSIS — M722 Plantar fascial fibromatosis: Secondary | ICD-10-CM | POA: Diagnosis not present

## 2019-02-01 NOTE — Patient Instructions (Signed)
Pre-Operative Instructions  Congratulations, you have decided to take an important step towards improving your quality of life.  You can be assured that the doctors and staff at [facility] will be with you every step of the way.  Here are some important things you should know:  1. Plan to be at the surgery center/hospital at least 1 (one) hour prior to your scheduled time, unless otherwise directed by the surgical center/hospital staff.  You must have a responsible adult accompany you, remain during the surgery and drive you home.  Make sure you have directions to the surgical center/hospital to ensure you arrive on time. 2. If you are having surgery at [facility] or [facility] hospitals, you will need a copy of your medical history and physical form from your family physician within one month prior to the date of surgery. We will give you a form for your primary physician to complete.  3. We make every effort to accommodate the date you request for surgery.  However, there are times where surgery dates or times have to be moved.  We will contact you as soon as possible if a change in schedule is required.   4. No aspirin/ibuprofen for one week before surgery.  If you are on aspirin, any non-steroidal anti-inflammatory medications (Mobic, Aleve, Ibuprofen) should not be taken seven (7) days prior to your surgery.  You make take Tylenol for pain prior to surgery.  5. Medications - If you are taking daily heart and blood pressure medications, seizure, reflux, allergy, asthma, anxiety, pain or diabetes medications, make sure you notify the surgery center/hospital before the day of surgery so they can tell you which medications you should take or avoid the day of surgery. 6. No food or drink after midnight the night before surgery unless directed otherwise by surgical center/hospital staff. 7. No alcoholic beverages 24-hours prior to surgery.  No smoking 24-hours prior or 24-hours after  surgery. 8. Wear loose pants or shorts. They should be loose enough to fit over bandages, boots, and casts. 9. Don't wear slip-on shoes. Sneakers are preferred. 10. Bring your boot with you to the surgery center/hospital.  Also bring crutches or a walker if your physician has prescribed it for you.  If you do not have this equipment, it will be provided for you after surgery. 11. If you have not been contacted by the surgery center/hospital by the day before your surgery, call to confirm the date and time of your surgery. 12. Leave-time from work may vary depending on the type of surgery you have.  Appropriate arrangements should be made prior to surgery with your employer. 13. Prescriptions will be provided immediately following surgery by your doctor.  Fill these as soon as possible after surgery and take the medication as directed. Pain medications will not be refilled on weekends and must be approved by the doctor. 14. Remove nail polish on the operative foot and avoid getting pedicures prior to surgery. 15. Wash the night before surgery.  The night before surgery wash the foot and leg well with water and the antibacterial soap provided. Be sure to pay special attention to beneath the toenails and in between the toes.  Wash for at least three (3) minutes. Rinse thoroughly with water and dry well with a towel.  Perform this wash unless told not to do so by your physician.  Enclosed: 1 Ice pack (please put in freezer the night before surgery)   1 Hibiclens skin cleaner     Pre-op instructions  If you have any questions regarding the instructions, please do not hesitate to call our office.  [place]: [address], [place], [place] [address] -- [phone]  [place]: [address] [place], [place] [address] -- [phone]  [place]: [address]  [place], [place] [address] -- [phone]   Website: [email or web address] 

## 2019-02-10 NOTE — Progress Notes (Signed)
   Subjective: 33 y.o. female presenting today for follow up evaluation of plantar fasciitis of the left foot. She reports continued pain that is unchanged since her last visit. She states the pain in the morning is still the worst. She has been using the plantar fascial band, taken the Medrol and Meloxicam as directed. Being on the foot increases the pain. Patient is here for further evaluation and treatment.   Past Medical History:  Diagnosis Date  . Anemia      Objective: Physical Exam General: The patient is alert and oriented x3 in no acute distress.  Dermatology: Skin is warm, dry and supple bilateral lower extremities. Negative for open lesions or macerations bilateral.   Vascular: Dorsalis Pedis and Posterior Tibial pulses palpable bilateral.  Capillary fill time is immediate to all digits.  Neurological: Epicritic and protective threshold intact bilateral.   Musculoskeletal: Tenderness to palpation to the plantar aspect of the left heel along the plantar fascia. All other joints range of motion within normal limits bilateral. Strength 5/5 in all groups bilateral.   Assessment: 1. Plantar fasciitis left foot  Plan of Care:  1. Patient evaluated.  2. Today we discussed the conservative versus surgical management of the presenting pathology. The patient opts for surgical management. All possible complications and details of the procedure were explained. All patient questions were answered. No guarantees were expressed or implied. 3. Authorization for surgery was initiated today. Surgery will consist of EPF left.  4. Patient trying to get pregnant so we will do a pregnancy test the morning of surgery in preop.  5. Return to clinic one week post op.   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      Edrick Kins, DPM [facility]  Dr. Edrick Kins, DPM    [address], [place] [address]                 Office [phone]  Fax [phone]

## 2019-02-15 ENCOUNTER — Other Ambulatory Visit: Payer: Self-pay

## 2019-02-15 DIAGNOSIS — Z20822 Contact with and (suspected) exposure to covid-19: Secondary | ICD-10-CM

## 2019-02-17 LAB — NOVEL CORONAVIRUS, NAA: SARS-CoV-2, NAA: NOT DETECTED

## 2019-02-18 ENCOUNTER — Telehealth: Payer: Self-pay | Admitting: *Deleted

## 2019-02-18 NOTE — Telephone Encounter (Signed)
Patient called ,given negative covid results . 

## 2019-04-22 ENCOUNTER — Ambulatory Visit: Payer: BC Managed Care – PPO | Admitting: Family Medicine

## 2019-04-22 ENCOUNTER — Other Ambulatory Visit: Payer: Self-pay

## 2019-05-11 ENCOUNTER — Ambulatory Visit: Payer: MEDICAID | Attending: Internal Medicine

## 2019-05-11 DIAGNOSIS — Z23 Encounter for immunization: Secondary | ICD-10-CM | POA: Insufficient documentation

## 2019-05-11 NOTE — Progress Notes (Signed)
   Covid-19 Vaccination Clinic  Name:  SALMA WALROND    MRN: [id number] DOB: 04/18/1985  05/11/2019  Ms. Sayavong was observed post Covid-19 immunization for 15 minutes without incidence. She was provided with Vaccine Information Sheet and instruction to access the V-Safe system.   Ms. Waszak was instructed to call 911 with any severe reactions post vaccine: Marland Kitchen Difficulty breathing  . Swelling of your face and throat  . A fast heartbeat  . A bad rash all over your body  . Dizziness and weakness    Immunizations Administered    Name Date Dose VIS Date Route   Moderna COVID-19 Vaccine 05/11/2019 12:07 PM 0.5 mL 02/12/2019 Intramuscular   Manufacturer: Moderna   Lot: [id number]   NDC: 31674-255-25

## 2019-06-08 ENCOUNTER — Ambulatory Visit: Payer: MEDICAID | Attending: Internal Medicine

## 2019-06-08 DIAGNOSIS — Z23 Encounter for immunization: Secondary | ICD-10-CM

## 2019-06-08 NOTE — Progress Notes (Signed)
   Covid-19 Vaccination Clinic  Name:  Heidi Bonilla    MRN: [id number] DOB: [birth year]  06/08/2019  Heidi Bonilla was observed post Covid-19 immunization for 15 minutes without incident. She was provided with Vaccine Information Sheet and instruction to access the V-Safe system.   Heidi Bonilla was instructed to call 911 with any severe reactions post vaccine: Marland Kitchen Difficulty breathing  . Swelling of face and throat  . A fast heartbeat  . A bad rash all over body  . Dizziness and weakness   Immunizations Administered    Name Date Dose VIS Date Route   Moderna COVID-19 Vaccine 06/08/2019 11:59 AM 0.5 mL 02/12/2019 Intramuscular   Manufacturer: Gala Murdoch   Lot: [id number]   NDC: [id number]

## 2020-03-04 ENCOUNTER — Other Ambulatory Visit: Admission: RE | Admit: 2020-03-04 | Payer: MEDICAID | Source: Ambulatory Visit

## 2020-03-23 ENCOUNTER — Other Ambulatory Visit
Admission: RE | Admit: 2020-03-23 | Discharge: 2020-03-23 | Disposition: A | Discharge status: Home / Self Care | Payer: BC Managed Care – PPO | Source: Ambulatory Visit | Attending: Obstetrics and Gynecology | Admitting: Obstetrics and Gynecology

## 2020-03-23 ENCOUNTER — Other Ambulatory Visit: Payer: Self-pay

## 2020-03-23 NOTE — Consult Note (Signed)
OB anesthesia consult :  I had the pleasure of speaking with Heidi Bonilla today regarding her upcoming C-S on Feb. 15th.  She expressed her concern that during the surgery, she did not have memory of the time around birth .  I explained to her that upon exteriorization of the uterus, some patients may transiently endorse increased discomfort that may require the anesthetic providers to address with pain meds that could affect memory.  I tried to reassure her, that although we may not be able to guarantee total clarity, that would be our goal and that we would do everything in our power to make her delivery a memorable one.  I would recommend adding both intrathecal fentanyl with the morphine to cover the manipulation of the uterus.

## 2020-04-09 NOTE — H&P (Signed)
Heidi Bonilla is a 35 y.o. female presenting for schedule repeat LTCS on 04/28/20 . EDC 05/02/20 , EGA 39+3 weeks  Based on LMP and confirmatory u/s  G3P2 with 2 prior c/s - desires elective repeat  Pregnancy complicated by + covid infection in pregnancy  , previously vaccinated  AMA  Obesity  Past Medical History:  Diagnosis Date  . Anemia    Past Surgical History:  Procedure Laterality Date  . CESAREAN SECTION N/A 2013  . CESAREAN SECTION N/A 02/09/2015   Procedure: CESAREAN SECTION;  Surgeon: Suzy Bouchard, MD;  Location: [facility] ORS;  Service: Obstetrics;  Laterality: N/A;  . WISDOM TOOTH EXTRACTION     Family History: family history is not on file. Social History:  reports that she has never smoked. She has never used smokeless tobacco. She reports current alcohol use. She reports that she does not use drugs. NKDA    Maternal Diabetes: No Genetic Screening: Declined Maternal Ultrasounds/Referrals: Normal Fetal Ultrasounds or other Referrals:  None Maternal Substance Abuse:  No Significant Maternal Medications:  None Significant Maternal Lab Results:  Other:  Other Comments:  None  Review of Systems Review of Systems: A full review of systems was performed and negative except as noted in the HPI.   Eyes: no vision change  Ears: left ear pain  Oropharynx: no sore throat  Pulmonary . No shortness of breath , no hemoptysis Cardiovascular: no chest pain , no irregular heart beat  Gastrointestinal:no blood in stool . No diarrhea, no constipation Uro gynecologic: no dysuria , no pelvic pain Neurologic : no seizure , no migraines    Musculoskeletal: no muscular weakness History   unknown if currently breastfeeding. Exam Physical Exam   Lungs CTA   Cv RRR  Abd: gravid   Prenatal labs: ABO, Rh:  B+ Antibody:  neg Rubella:  Imm/ Varicella IMM RPR:   NR HBsAg:   neg  HIV:   neg  GBS:   done 04/09/20  Assessment/Plan: Elective repeat LTCS on 04/28/20 Declines  BTL     Ihor Austin Shadrick Senne 04/09/2020, 9:16 AM

## 2020-04-23 ENCOUNTER — Encounter
Admission: RE | Admit: 2020-04-23 | Discharge: 2020-04-23 | Disposition: A | Discharge status: Home / Self Care | Payer: BC Managed Care – PPO | Source: Ambulatory Visit | Attending: Obstetrics and Gynecology | Admitting: Obstetrics and Gynecology

## 2020-04-23 ENCOUNTER — Other Ambulatory Visit: Payer: Self-pay

## 2020-04-23 HISTORY — DX: Gastro-esophageal reflux disease without esophagitis: K21.9

## 2020-04-23 NOTE — Patient Instructions (Addendum)
Arrival Time: Please call Labor and Delivery the day before your scheduled C-Section to find out your arrival time. [phone].  Arrival: Arrive to the [facility] If your arrival time is prior to 6:00am, please call Labor and Delivery [phone] from your cell phone upon arrival and someone from L&D or security will come down to the Medical Mall entrance and escort you to Labor and Delivery. If your arrival time is 6:00am or later, please enter the Medical Mall and follow the greeter's instructions.   Partner: ONE dedicated partner/guest is allowed to accompany you to Labor and Delivery and into the OR. You may have TWO dedicated visitors during the remainder of your stay on the Mother/Baby Unit. Your guests may leave and return during normal visiting hours, but will have to be re-screened with each entry to the [facility] Mall. These two visitors are not allowed to switch out for a new guest at any point during your hospital stay.  REMEMBER: Instructions that are not followed completely may result in serious medical risk, up to and including death; or upon the discretion of your surgeon and anesthesiologist your surgery may need to be rescheduled.  Do not eat food after midnight the night before surgery.  No gum chewing, lozengers or hard candies.  You may however, drink CLEAR liquids up to 2 hours before you are scheduled to arrive for your surgery. Do not drink anything within 2 hours of your scheduled arrival time.  Clear liquids include: - water  - apple juice without pulp - gatorade (not RED, PURPLE, OR BLUE) - black coffee or tea (Do NOT add milk or creamers to the coffee or tea) Do NOT drink anything that is not on this list.  DO NOT TAKE ANY MEDICATIONS THE MORNING OF SURGERY  One week prior to surgery: STARTING TODAY, February 10 Stop aspirin and Anti-inflammatories (NSAIDS) such as Advil, Aleve, Ibuprofen, Motrin, Naproxen, Naprosyn and Aspirin based products such as  Excedrin, Goodys Powder, BC Powder. Stop ANY OVER THE COUNTER supplements until after surgery. (However, you may continue taking iron and multivitamin up until the day before surgery.)  No Alcohol for 24 hours before or after surgery.  No Smoking including e-cigarettes for 24 hours prior to surgery.  No chewable tobacco products for at least 6 hours prior to surgery.  No nicotine patches on the day of surgery.  Do not use any "recreational" drugs for at least a week prior to your surgery.  Please be advised that the combination of cocaine and anesthesia may have negative outcomes, up to and including death. If you test positive for cocaine, your surgery will be cancelled.  On the morning of surgery brush your teeth with toothpaste and water, you may rinse your mouth with mouthwash if you wish. Do not swallow any toothpaste or mouthwash.  Do not wear jewelry, make-up, hairpins, clips or nail polish.  Do not wear lotions, powders, or perfumes.   Do not shave body from the neck down 48 hours prior to surgery just in case you cut yourself which could leave a site for infection.  Also, freshly shaved skin may become irritated if using the CHG soap.  Contact lenses, hearing aids and dentures may not be worn into surgery.  Do not bring valuables to the hospital. [facility] is not responsible for any missing/lost belongings or valuables.   Use CHG wipes as directed on instruction sheet.   Notify your doctor if there is any change in your medical condition (cold,  fever, infection).  Wear comfortable clothing (specific to your surgery type) to the hospital.  Plan for stool softeners for home use; pain medications have a tendency to cause constipation. You can also help prevent constipation by eating foods high in fiber such as fruits and vegetables and drinking plenty of fluids as your diet allows.  After surgery, you can help prevent lung complications by doing breathing exercises.   Take deep breaths and cough every 1-2 hours. Your doctor may order a device called an Incentive Spirometer to help you take deep breaths. When coughing or sneezing, hold a pillow firmly against your incision with both hands. This is called "splinting." Doing this helps protect your incision. It also decreases belly discomfort.  Please call the Pre-admissions Testing Dept. at [phone] if you have any questions about these instructions.

## 2020-04-27 ENCOUNTER — Encounter
Admission: RE | Admit: 2020-04-27 | Discharge: 2020-04-27 | Disposition: A | Discharge status: Home / Self Care | Payer: BC Managed Care – PPO | Source: Ambulatory Visit | Attending: Obstetrics and Gynecology | Admitting: Obstetrics and Gynecology

## 2020-04-27 ENCOUNTER — Other Ambulatory Visit: Payer: BC Managed Care – PPO

## 2020-04-27 ENCOUNTER — Other Ambulatory Visit: Payer: Self-pay

## 2020-04-27 DIAGNOSIS — Z01812 Encounter for preprocedural laboratory examination: Secondary | ICD-10-CM | POA: Insufficient documentation

## 2020-04-27 DIAGNOSIS — Z20822 Contact with and (suspected) exposure to covid-19: Secondary | ICD-10-CM | POA: Insufficient documentation

## 2020-04-27 LAB — BASIC METABOLIC PANEL
Anion gap: 9 (ref 5–15)
BUN: 7 mg/dL (ref 6–20)
CO2: 22 mmol/L (ref 22–32)
Calcium: 8.9 mg/dL (ref 8.9–10.3)
Chloride: 105 mmol/L (ref 98–111)
Creatinine, Ser: 0.54 mg/dL (ref 0.44–1.00)
GFR, Estimated: >60 mL/min (ref >60)
Glucose, Bld: 100 mg/dL — ABNORMAL HIGH (ref 70–99)
Potassium: 3.7 mmol/L (ref 3.5–5.1)
Sodium: 136 mmol/L (ref 135–145)

## 2020-04-27 LAB — SARS CORONAVIRUS 2 (TAT 6-24 HRS): SARS Coronavirus 2: NEGATIVE

## 2020-04-27 LAB — CBC
HCT: 31.7 % — ABNORMAL LOW (ref 36.0–46.0)
Hemoglobin: 10.7 g/dL — ABNORMAL LOW (ref 12.0–15.0)
MCH: 29.6 pg (ref 26.0–34.0)
MCHC: 33.8 g/dL (ref 30.0–36.0)
MCV: 87.8 fL (ref 80.0–100.0)
Platelets: 123 10*3/uL — ABNORMAL LOW (ref 150–400)
RBC: 3.61 MIL/uL — ABNORMAL LOW (ref 3.87–5.11)
RDW: 12.8 % (ref 11.5–15.5)
WBC: 6.5 10*3/uL (ref 4.0–10.5)
nRBC: 0 % (ref 0.0–0.2)

## 2020-04-27 MED ORDER — ORAL CARE MOUTH RINSE: 15.0000 mL | Freq: Once | OROMUCOSAL | Status: AC

## 2020-04-27 MED ORDER — LACTATED RINGERS IV SOLN: INTRAVENOUS | Status: DC

## 2020-04-27 MED ORDER — GABAPENTIN 300 MG PO CAPS
300.0000 mg | ORAL_CAPSULE | Freq: Once | ORAL | Status: AC
  Administered 2020-04-28: 300 mg via ORAL
  Filled 2020-04-27: qty 1

## 2020-04-27 MED ORDER — ACETAMINOPHEN 500 MG PO TABS
1000.0000 mg | ORAL_TABLET | Freq: Once | ORAL | Status: AC
  Administered 2020-04-28: 1000 mg via ORAL
  Filled 2020-04-27: qty 2

## 2020-04-27 MED ORDER — CHLORHEXIDINE GLUCONATE 0.12 % MT SOLN
15.0000 mL | Freq: Once | OROMUCOSAL | Status: AC
  Administered 2020-04-28: 15 mL via OROMUCOSAL
  Filled 2020-04-27: qty 15

## 2020-04-28 ENCOUNTER — Encounter
Admission: RE | Disposition: A | Discharge status: Home / Self Care | Payer: Self-pay | Source: Home / Self Care | Attending: Obstetrics and Gynecology

## 2020-04-28 ENCOUNTER — Inpatient Hospital Stay: Payer: BC Managed Care – PPO | Admitting: Anesthesiology

## 2020-04-28 ENCOUNTER — Encounter: Payer: Self-pay | Admitting: Obstetrics and Gynecology

## 2020-04-28 ENCOUNTER — Inpatient Hospital Stay
Admission: RE | Admit: 2020-04-28 | Discharge: 2020-04-30 | DRG: 787 | Disposition: A | Discharge status: Home / Self Care | Payer: BC Managed Care – PPO | Attending: Obstetrics and Gynecology | Admitting: Obstetrics and Gynecology

## 2020-04-28 DIAGNOSIS — O34219 Maternal care for unspecified type scar from previous cesarean delivery: Secondary | ICD-10-CM | POA: Diagnosis present

## 2020-04-28 DIAGNOSIS — O34211 Maternal care for low transverse scar from previous cesarean delivery: Principal | ICD-10-CM | POA: Diagnosis present

## 2020-04-28 DIAGNOSIS — O9081 Anemia of the puerperium: Secondary | ICD-10-CM | POA: Diagnosis not present

## 2020-04-28 DIAGNOSIS — O99214 Obesity complicating childbirth: Secondary | ICD-10-CM | POA: Diagnosis present

## 2020-04-28 DIAGNOSIS — Z3A39 39 weeks gestation of pregnancy: Secondary | ICD-10-CM

## 2020-04-28 DIAGNOSIS — Z8616 Personal history of COVID-19: Secondary | ICD-10-CM

## 2020-04-28 DIAGNOSIS — E669 Obesity, unspecified: Secondary | ICD-10-CM | POA: Diagnosis present

## 2020-04-28 DIAGNOSIS — D62 Acute posthemorrhagic anemia: Secondary | ICD-10-CM | POA: Diagnosis not present

## 2020-04-28 DIAGNOSIS — Z98891 History of uterine scar from previous surgery: Secondary | ICD-10-CM

## 2020-04-28 LAB — TYPE AND SCREEN
ABO/RH(D): B POS
ABO/RH(D): B POS
Antibody Screen: NEGATIVE
Antibody Screen: NEGATIVE
Extend sample reason: UNDETERMINED

## 2020-04-28 LAB — GLUCOSE, CAPILLARY: Glucose-Capillary: 81 mg/dL (ref 70–99)

## 2020-04-28 SURGERY — Surgical Case
Anesthesia: Spinal

## 2020-04-28 MED ORDER — ONDANSETRON HCL 4 MG/2ML IJ SOLN
INTRAMUSCULAR | Status: AC
  Filled 2020-04-28: qty 2

## 2020-04-28 MED ORDER — NALBUPHINE HCL 10 MG/ML IJ SOLN: 5.0000 mg | Freq: Once | INTRAMUSCULAR | Status: DC | PRN

## 2020-04-28 MED ORDER — MEPERIDINE HCL 50 MG/ML IJ SOLN: 6.2500 mg | INTRAMUSCULAR | Status: DC | PRN

## 2020-04-28 MED ORDER — MORPHINE SULFATE (PF) 2 MG/ML IV SOLN: 1.0000 mg | INTRAVENOUS | Status: DC | PRN

## 2020-04-28 MED ORDER — BUPIVACAINE IN DEXTROSE 0.75-8.25 % IT SOLN
INTRATHECAL | Status: DC | PRN
  Administered 2020-04-28: 1.6 mL via INTRATHECAL

## 2020-04-28 MED ORDER — SODIUM CHLORIDE 0.9% FLUSH: 3.0000 mL | INTRAVENOUS | Status: DC | PRN

## 2020-04-28 MED ORDER — TETANUS-DIPHTH-ACELL PERTUSSIS 5-2.5-18.5 LF-MCG/0.5 IM SUSY: 0.5000 mL | PREFILLED_SYRINGE | Freq: Once | INTRAMUSCULAR | Status: DC

## 2020-04-28 MED ORDER — MORPHINE SULFATE (PF) 0.5 MG/ML IJ SOLN
INTRAMUSCULAR | Status: AC
  Filled 2020-04-28: qty 10

## 2020-04-28 MED ORDER — KETOROLAC TROMETHAMINE 30 MG/ML IJ SOLN: 30.0000 mg | Freq: Four times a day (QID) | INTRAMUSCULAR | Status: DC

## 2020-04-28 MED ORDER — ACETAMINOPHEN 500 MG PO TABS
1000.0000 mg | ORAL_TABLET | Freq: Four times a day (QID) | ORAL | Status: DC
  Administered 2020-04-28 (×2): 1000 mg via ORAL
  Filled 2020-04-28: qty 2

## 2020-04-28 MED ORDER — OXYCODONE HCL 5 MG PO TABS: 5.0000 mg | ORAL_TABLET | ORAL | Status: DC | PRN

## 2020-04-28 MED ORDER — SIMETHICONE 80 MG PO CHEW: 80.0000 mg | CHEWABLE_TABLET | ORAL | Status: DC | PRN

## 2020-04-28 MED ORDER — SENNOSIDES-DOCUSATE SODIUM 8.6-50 MG PO TABS
2.0000 | ORAL_TABLET | Freq: Every day | ORAL | Status: DC
  Administered 2020-04-29 – 2020-04-30 (×2): 2 via ORAL
  Filled 2020-04-28 (×2): qty 2

## 2020-04-28 MED ORDER — DIBUCAINE (PERIANAL) 1 % EX OINT: 1.0000 "application " | TOPICAL_OINTMENT | CUTANEOUS | Status: DC | PRN

## 2020-04-28 MED ORDER — FENTANYL CITRATE (PF) 100 MCG/2ML IJ SOLN
INTRAMUSCULAR | Status: DC | PRN
  Administered 2020-04-28: 15 ug via INTRATHECAL

## 2020-04-28 MED ORDER — SIMETHICONE 80 MG PO CHEW
80.0000 mg | CHEWABLE_TABLET | Freq: Three times a day (TID) | ORAL | Status: DC
  Administered 2020-04-28 – 2020-04-30 (×5): 80 mg via ORAL
  Filled 2020-04-28 (×5): qty 1

## 2020-04-28 MED ORDER — GLYCOPYRROLATE 0.2 MG/ML IJ SOLN
INTRAMUSCULAR | Status: DC | PRN
  Administered 2020-04-28: .2 mg via INTRAVENOUS

## 2020-04-28 MED ORDER — ACETAMINOPHEN 500 MG PO TABS
1000.0000 mg | ORAL_TABLET | Freq: Four times a day (QID) | ORAL | Status: DC
  Administered 2020-04-29 (×3): 1000 mg via ORAL
  Filled 2020-04-28 (×4): qty 2

## 2020-04-28 MED ORDER — NALBUPHINE HCL 10 MG/ML IJ SOLN: 5.0000 mg | INTRAMUSCULAR | Status: DC | PRN

## 2020-04-28 MED ORDER — ZOLPIDEM TARTRATE 5 MG PO TABS: 5.0000 mg | ORAL_TABLET | Freq: Every evening | ORAL | Status: DC | PRN

## 2020-04-28 MED ORDER — OXYTOCIN-SODIUM CHLORIDE 30-0.9 UT/500ML-% IV SOLN
2.5000 [IU]/h | INTRAVENOUS | Status: AC
  Administered 2020-04-28 (×2): 2.5 [IU]/h via INTRAVENOUS
  Filled 2020-04-28: qty 500

## 2020-04-28 MED ORDER — NALOXONE HCL 4 MG/10ML IJ SOLN
1.0000 ug/kg/h | INTRAVENOUS | Status: DC | PRN
  Filled 2020-04-28: qty 5

## 2020-04-28 MED ORDER — SODIUM CHLORIDE (PF) 0.9 % IJ SOLN
INTRAMUSCULAR | Status: AC
  Filled 2020-04-28: qty 50

## 2020-04-28 MED ORDER — BUPIVACAINE HCL (PF) 0.5 % IJ SOLN
INTRAMUSCULAR | Status: DC | PRN
  Administered 2020-04-28: 30 mL

## 2020-04-28 MED ORDER — CEFAZOLIN SODIUM-DEXTROSE 2-4 GM/100ML-% IV SOLN
2.0000 g | INTRAVENOUS | Status: AC
  Administered 2020-04-28: 2 g via INTRAVENOUS
  Filled 2020-04-28: qty 100

## 2020-04-28 MED ORDER — SODIUM CHLORIDE 0.9 % IR SOLN
Status: DC | PRN
  Administered 2020-04-28: 50 mL

## 2020-04-28 MED ORDER — OXYCODONE HCL 5 MG PO TABS: 10.0000 mg | ORAL_TABLET | ORAL | Status: DC | PRN

## 2020-04-28 MED ORDER — SOD CITRATE-CITRIC ACID 500-334 MG/5ML PO SOLN
30.0000 mL | ORAL | Status: AC
  Administered 2020-04-28: 30 mL via ORAL
  Filled 2020-04-28: qty 15

## 2020-04-28 MED ORDER — BUPIVACAINE LIPOSOME 1.3 % IJ SUSP
INTRAMUSCULAR | Status: AC
  Filled 2020-04-28: qty 20

## 2020-04-28 MED ORDER — SCOPOLAMINE 1 MG/3DAYS TD PT72
1.0000 | MEDICATED_PATCH | Freq: Once | TRANSDERMAL | Status: DC
  Administered 2020-04-28: 1.5 mg via TRANSDERMAL
  Filled 2020-04-28: qty 1

## 2020-04-28 MED ORDER — ENOXAPARIN SODIUM 40 MG/0.4ML ~~LOC~~ SOLN
40.0000 mg | SUBCUTANEOUS | Status: DC
  Administered 2020-04-29 – 2020-04-30 (×2): 40 mg via SUBCUTANEOUS
  Filled 2020-04-28 (×2): qty 0.4

## 2020-04-28 MED ORDER — PHENYLEPHRINE HCL (PRESSORS) 10 MG/ML IV SOLN
INTRAVENOUS | Status: AC
  Filled 2020-04-28: qty 1

## 2020-04-28 MED ORDER — IBUPROFEN 800 MG PO TABS
800.0000 mg | ORAL_TABLET | Freq: Four times a day (QID) | ORAL | Status: DC
  Administered 2020-04-29: 800 mg via ORAL
  Filled 2020-04-28: qty 1

## 2020-04-28 MED ORDER — NALOXONE HCL 0.4 MG/ML IJ SOLN: 0.4000 mg | INTRAMUSCULAR | Status: DC | PRN

## 2020-04-28 MED ORDER — SODIUM CHLORIDE 0.9 % IV SOLN
INTRAVENOUS | Status: DC | PRN
  Administered 2020-04-28: 50 ug/min via INTRAVENOUS

## 2020-04-28 MED ORDER — FENTANYL CITRATE (PF) 100 MCG/2ML IJ SOLN: INTRAMUSCULAR | Status: DC | PRN

## 2020-04-28 MED ORDER — MORPHINE SULFATE (PF) 0.5 MG/ML IJ SOLN: INTRAMUSCULAR | Status: DC | PRN

## 2020-04-28 MED ORDER — GABAPENTIN 300 MG PO CAPS
300.0000 mg | ORAL_CAPSULE | Freq: Every day | ORAL | Status: DC
  Administered 2020-04-28 – 2020-04-29 (×2): 300 mg via ORAL
  Filled 2020-04-28 (×2): qty 1

## 2020-04-28 MED ORDER — MENTHOL 3 MG MT LOZG
1.0000 | LOZENGE | OROMUCOSAL | Status: DC | PRN
Start: 2020-04-28 — End: 2020-04-30
  Filled 2020-04-28: qty 9

## 2020-04-28 MED ORDER — OXYTOCIN-SODIUM CHLORIDE 30-0.9 UT/500ML-% IV SOLN
INTRAVENOUS | Status: AC
  Filled 2020-04-28: qty 500

## 2020-04-28 MED ORDER — NALBUPHINE HCL 10 MG/ML IJ SOLN
5.0000 mg | Freq: Once | INTRAMUSCULAR | Status: DC | PRN
Start: 2020-04-28 — End: 2020-04-30

## 2020-04-28 MED ORDER — OXYTOCIN-SODIUM CHLORIDE 30-0.9 UT/500ML-% IV SOLN
INTRAVENOUS | Status: DC | PRN
  Administered 2020-04-28: 62.5 mL/h via INTRAVENOUS
  Administered 2020-04-28: 250 mL/h via INTRAVENOUS

## 2020-04-28 MED ORDER — MORPHINE SULFATE (PF) 0.5 MG/ML IJ SOLN
INTRAMUSCULAR | Status: DC | PRN
  Administered 2020-04-28: 100 ug via INTRATHECAL

## 2020-04-28 MED ORDER — ONDANSETRON HCL 4 MG/2ML IJ SOLN
INTRAMUSCULAR | Status: DC | PRN
  Administered 2020-04-28: 4 mg via INTRAVENOUS

## 2020-04-28 MED ORDER — EPHEDRINE SULFATE 50 MG/ML IJ SOLN
INTRAMUSCULAR | Status: DC | PRN
  Administered 2020-04-28 (×2): 10 mg via INTRAVENOUS
  Administered 2020-04-28: 5 mg via INTRAVENOUS

## 2020-04-28 MED ORDER — WITCH HAZEL-GLYCERIN EX PADS: 1.0000 "application " | MEDICATED_PAD | CUTANEOUS | Status: DC | PRN

## 2020-04-28 MED ORDER — BUPIVACAINE HCL (PF) 0.5 % IJ SOLN
INTRAMUSCULAR | Status: AC
  Filled 2020-04-28: qty 30

## 2020-04-28 MED ORDER — KETOROLAC TROMETHAMINE 30 MG/ML IJ SOLN: INTRAMUSCULAR | Status: DC | PRN

## 2020-04-28 MED ORDER — KETOROLAC TROMETHAMINE 30 MG/ML IJ SOLN
30.0000 mg | Freq: Once | INTRAMUSCULAR | Status: AC
  Administered 2020-04-28: 30 mg via INTRAVENOUS
  Filled 2020-04-28: qty 1

## 2020-04-28 MED ORDER — DEXAMETHASONE SODIUM PHOSPHATE 4 MG/ML IJ SOLN
INTRAMUSCULAR | Status: DC | PRN
  Administered 2020-04-28: 10 mg via INTRAVENOUS

## 2020-04-28 MED ORDER — NALBUPHINE HCL 10 MG/ML IJ SOLN
5.0000 mg | INTRAMUSCULAR | Status: DC | PRN
Start: 2020-04-28 — End: 2020-04-30

## 2020-04-28 MED ORDER — PHENYLEPHRINE HCL (PRESSORS) 10 MG/ML IV SOLN
INTRAVENOUS | Status: DC | PRN
  Administered 2020-04-28: 100 ug via INTRAVENOUS

## 2020-04-28 MED ORDER — KETOROLAC TROMETHAMINE 30 MG/ML IJ SOLN
30.0000 mg | Freq: Four times a day (QID) | INTRAMUSCULAR | Status: DC
  Filled 2020-04-28: qty 1

## 2020-04-28 MED ORDER — DIPHENHYDRAMINE HCL 25 MG PO CAPS
25.0000 mg | ORAL_CAPSULE | Freq: Four times a day (QID) | ORAL | Status: DC | PRN
Start: 2020-04-28 — End: 2020-04-30

## 2020-04-28 MED ORDER — COCONUT OIL OIL
1.0000 "application " | TOPICAL_OIL | Status: DC | PRN
  Administered 2020-04-28: 1 via TOPICAL
  Filled 2020-04-28: qty 120

## 2020-04-28 MED ORDER — ONDANSETRON HCL 4 MG/2ML IJ SOLN
4.0000 mg | Freq: Three times a day (TID) | INTRAMUSCULAR | Status: DC | PRN
Start: 2020-04-28 — End: 2020-04-30

## 2020-04-28 MED ORDER — KETOROLAC TROMETHAMINE 30 MG/ML IJ SOLN
30.0000 mg | Freq: Four times a day (QID) | INTRAMUSCULAR | Status: AC
  Administered 2020-04-28 – 2020-04-29 (×3): 30 mg via INTRAVENOUS
  Filled 2020-04-28 (×2): qty 1

## 2020-04-28 MED ORDER — FENTANYL CITRATE (PF) 100 MCG/2ML IJ SOLN
INTRAMUSCULAR | Status: AC
  Filled 2020-04-28: qty 2

## 2020-04-28 MED ORDER — BUPIVACAINE LIPOSOME 1.3 % IJ SUSP
INTRAMUSCULAR | Status: DC | PRN
  Administered 2020-04-28: 20 mL

## 2020-04-28 MED ORDER — PRENATAL MULTIVITAMIN CH
1.0000 | ORAL_TABLET | Freq: Every day | ORAL | Status: DC
  Administered 2020-04-29: 1 via ORAL
  Filled 2020-04-28: qty 1

## 2020-04-28 SURGICAL SUPPLY — 28 items
BARRIER ADHS 3X4 INTERCEED (GAUZE/BANDAGES/DRESSINGS) ×2 IMPLANT
CANISTER SUCT 3000ML PPV (MISCELLANEOUS) ×2 IMPLANT
CHLORAPREP W/TINT 26 (MISCELLANEOUS) ×2 IMPLANT
COVER WAND RF STERILE (DRAPES) ×2 IMPLANT
DRESSING TELFA 8X3 (GAUZE/BANDAGES/DRESSINGS) ×2 IMPLANT
DRSG TELFA 3X8 NADH (GAUZE/BANDAGES/DRESSINGS) ×2 IMPLANT
ELECT CAUTERY BLADE 6.4 (BLADE) ×2 IMPLANT
ELECT REM PT RETURN 9FT ADLT (ELECTROSURGICAL) ×2
ELECTRODE REM PT RTRN 9FT ADLT (ELECTROSURGICAL) ×1 IMPLANT
GAUZE SPONGE 4X4 12PLY STRL (GAUZE/BANDAGES/DRESSINGS) ×2 IMPLANT
GLOVE SURG SYN 8.0 (GLOVE) ×2 IMPLANT
GOWN STRL REUS W/ TWL LRG LVL3 (GOWN DISPOSABLE) ×2 IMPLANT
GOWN STRL REUS W/ TWL XL LVL3 (GOWN DISPOSABLE) ×1 IMPLANT
GOWN STRL REUS W/TWL LRG LVL3 (GOWN DISPOSABLE) ×2
GOWN STRL REUS W/TWL XL LVL3 (GOWN DISPOSABLE) ×1
MANIFOLD NEPTUNE II (INSTRUMENTS) ×2 IMPLANT
NEEDLE HYPO 22GX1.5 SAFETY (NEEDLE) ×2 IMPLANT
NS IRRIG 1000ML POUR BTL (IV SOLUTION) ×2 IMPLANT
PACK C SECTION AR (MISCELLANEOUS) ×2 IMPLANT
PAD OB MATERNITY 4.3X12.25 (PERSONAL CARE ITEMS) ×2 IMPLANT
PAD PREP 24X41 OB/GYN DISP (PERSONAL CARE ITEMS) ×2 IMPLANT
STAPLER INSORB 30 2030 C-SECTI (MISCELLANEOUS) ×2 IMPLANT
STRAP SAFETY 5IN WIDE (MISCELLANEOUS) ×2 IMPLANT
SUCT VACUUM KIWI BELL (SUCTIONS) ×2 IMPLANT
SUT CHROMIC 1 CTX 36 (SUTURE) ×6 IMPLANT
SUT PLAIN GUT 0 (SUTURE) ×4 IMPLANT
SUT VIC AB 0 CT1 36 (SUTURE) ×4 IMPLANT
SYR 30ML LL (SYRINGE) ×4 IMPLANT

## 2020-04-28 NOTE — Lactation Note (Signed)
This note was copied from a baby's chart. Lactation Consultation Note  Patient Name: Heidi Bonilla [id number] Date: 04/28/2020 Reason for consult: Initial assessment Age:37 hours  Lactation to the room for initial visit. Mother is holding the baby. Mother stated baby had just completed a feed for 10 minutes on each side and did really well. She BF her other children without difficulty.  Encouraged feeding on demand and with cues. If baby is not cueing encouraged hand expression and skin to skin. Encouraged 8 or more attempts in the first 24 hours and 8 or more good feeds after 24 HOL. Reviewed appropriate diapers for days of life and How to know your baby is getting enough to eat. Reviewed "Understanding Postpartum and Newborn Care" booklet at bedside. [facility] # left on board, encouraged to call for any assistance. Mother has no further questions at this time.   Maternal Data Has patient been taught Hand Expression?: Yes (discussed it, reviewed in booklet)  Feeding Mother's Current Feeding Choice: Breast Milk    Interventions Interventions: Breast feeding basics reviewed;Education  Discharge Pump: Personal (Medela at home)  Consult Status Consult Status: Follow-up Date: 04/29/20 Follow-up type: Call as needed    Tucker Minter D Channell Quattrone 04/28/2020, 5:19 PM

## 2020-04-28 NOTE — Anesthesia Preprocedure Evaluation (Signed)
Anesthesia Evaluation  Patient identified by MRN, date of birth, ID band Patient awake    Reviewed: Allergy & Precautions, NPO status , Patient's Chart, lab work & pertinent test results  History of Anesthesia Complications Negative for: history of anesthetic complications  Airway Mallampati: II  TM Distance: >3 FB Neck ROM: Full    Dental no notable dental hx. (+) Teeth Intact   Pulmonary neg pulmonary ROS, neg sleep apnea, neg COPD, Patient abstained from smoking.Not current smoker,    Pulmonary exam normal breath sounds clear to auscultation       Cardiovascular Exercise Tolerance: Good METS(-) hypertension(-) CAD and (-) Past MI negative cardio ROS  (-) dysrhythmias  Rhythm:Regular Rate:Normal - Systolic murmurs    Neuro/Psych negative neurological ROS  negative psych ROS   GI/Hepatic GERD  ,(+)     (-) substance abuse  ,   Endo/Other  neg diabetes  Renal/GU negative Renal ROS     Musculoskeletal   Abdominal   Peds  Hematology  (+) anemia ,   Anesthesia Other Findings Past Medical History: No date: Anemia 2016: Complication of anesthesia     Comment:  last c-section was unable to recall anything during               procedure No date: GERD (gastroesophageal reflux disease)     Comment:  only during pregnancy  Reproductive/Obstetrics (+) Pregnancy                             Anesthesia Physical Anesthesia Plan  ASA: II  Anesthesia Plan: Spinal   Post-op Pain Management:    Induction:   PONV Risk Score and Plan: 4 or greater and Ondansetron and Dexamethasone  Airway Management Planned: Natural Airway  Additional Equipment:   Intra-op Plan:   Post-operative Plan:   Informed Consent: I have reviewed the patients History and Physical, chart, labs and discussed the procedure including the risks, benefits and alternatives for the proposed anesthesia with the patient  or authorized representative who has indicated his/her understanding and acceptance.       Plan Discussed with: CRNA and Surgeon  Anesthesia Plan Comments: (Discussed R/B/A of neuraxial anesthesia technique with patient: - rare risks of spinal/epidural hematoma, nerve damage, infection - Risk of PDPH - Risk of itching - Risk of nausea and vomiting - Risk of conversion to general anesthesia and its associated risks, including sore throat, damage to lips/teeth/oropharynx, and rare risks such as cardiac and respiratory events. - Risk of surgical bleeding requiring blood products Patient voiced understanding.)        Anesthesia Quick Evaluation

## 2020-04-28 NOTE — Transfer of Care (Signed)
Immediate Anesthesia Transfer of Care Note  Patient: Heidi Bonilla  Procedure(s) Performed: REPEAT CESAREAN SECTION (N/A )  Patient Location: Mother/Baby  Anesthesia Type:Spinal  Level of Consciousness: awake, alert  and oriented  Airway & Oxygen Therapy: Patient Spontanous Breathing  Post-op Assessment: Report given to RN and Post -op Vital signs reviewed and stable  Post vital signs: Reviewed and stable  Last Vitals:  Vitals Value Taken Time  BP 104/80(89)   Temp 97.4   Pulse 86   Resp 21   SpO2 97     Last Pain:  Vitals:   04/28/20 0710  TempSrc: Oral  PainSc: 0-No pain         Complications: No complications documented.

## 2020-04-28 NOTE — Progress Notes (Signed)
Pt is  Ready for repeat c/s . All questions answered . LAbs reviewed . Proceed

## 2020-04-28 NOTE — Brief Op Note (Signed)
04/28/2020  9:12 AM  PATIENT:  Heidi Bonilla  35 y.o. female  PRE-OPERATIVE DIAGNOSIS:  elective repeat cesarean  POST-OPERATIVE DIAGNOSIS:  elective repeat cesarean Vigorous female delivered  PROCEDURE:  Procedure(s): REPEAT CESAREAN SECTION (N/A) LTCS SURGEON:  Surgeon(s) and Role:    * Tunya Held, Ihor Austin, MD - Primary  PHYSICIAN ASSISTANT: Margaretmary Eddy, CNM   ASSISTANTS: none   ANESTHESIA:   spinal  EBL:  400 mL IOF 600cc  BLOOD ADMINISTERED:none  DRAINS: Urinary Catheter (Foley)   LOCAL MEDICATIONS USED:  MARCAINE    and BUPIVICAINE   SPECIMEN:  No Specimen  DISPOSITION OF SPECIMEN:  N/A  COUNTS:  YES  TOURNIQUET:  * No tourniquets in log *  DICTATION: .Other Dictation: Dictation Number verbal  PLAN OF CARE: Admit to inpatient   PATIENT DISPOSITION:  PACU - hemodynamically stable.   Delay start of Pharmacological VTE agent (>24hrs) due to surgical blood loss or risk of bleeding: not applicable

## 2020-04-28 NOTE — Discharge Summary (Signed)
Obstetrical Discharge Summary  Patient Name: Heidi Bonilla DOB: [birth year] MRN: [id number]  Date of Admission: 04/28/2020 Date of Delivery:04/28/2020 Delivered by: Beverly Gust MD Date of Discharge: 04/30/20 Primary OB: [facility]  VPX:[id number] last menstrual period was 07/27/2019. EDC Estimated Date of Delivery: 05/02/20 Gestational Age at Delivery: [redacted]w[redacted]d   Antepartum complications:obesity ,ama, anemia, covid in pregnancy  Admitting Diagnosis:elective repeat cesarean section   Secondary Diagnosis: Patient Active Problem List   Diagnosis Date Noted  . S/P cesarean section 04/30/2020  . Acute blood loss anemia 04/30/2020  . Uterine size date discrepancy pregnancy, third trimester 09/20/2018  . H/O cesarean section 02/09/2015  . Cesarean delivery delivered 02/09/2015  . Postoperative state 02/09/2015  . Candidiasis, vagina 12/03/2014  . 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 Vaginitis affecting pregnancy in third trimester, antepartum 12/03/2014  . Indication for care in labor or delivery 11/24/2014    Augmentation: N/A Complications: None Intrapartum complications/course:  Date of Delivery: 04/30/2020 Delivered By: Beverly Gust MD Delivery Type: repeat LTCS  Anesthesia:spinal Placenta: manual Laceration:  Episiotomy: n/a Newborn Data: Live born female  Birth Weight:  3560 gm  APGAR: , 9/9  TOB 0828 on 04/28/2020  Newborn Delivery   Birth date/time:  Delivery type:         Postpartum Procedures: none  Edinburgh:  Edinburgh Postnatal Depression Scale Screening Tool 04/29/2020 04/28/2020  I have been able to laugh and see the funny side of things. 0 (No Data)  I have looked forward with enjoyment to things. 0 -  I have blamed myself unnecessarily when things went wrong. 0 -  I have been anxious or worried for no good reason. 0 -  I have felt scared or panicky for no good reason. 0 -  Things have been  getting on top of me. 1 -  I have been so unhappy that I have had difficulty sleeping. 0 -  I have felt sad or miserable. 0 -  I have been so unhappy that I have been crying. 1 -  The thought of harming myself has occurred to me. 0 -  Edinburgh Postnatal Depression Scale Total 2 -    Post partum course:   Patient had an uncomplicated postpartum course.  By time of discharge on POD#2, her pain was controlled on oral pain medications; she had appropriate lochia and was ambulating, voiding without difficulty, tolerating regular diet and passing flatus.   She was deemed stable for discharge to home.    Discharge Physical Exam:  BP 120/71   Pulse 69   Temp (!) 97.4 F (36.3 C)   Resp 18   Ht 5\' 6"  (1.676 m)   Wt 93 kg   LMP 07/27/2019   SpO2 99%   Breastfeeding Unknown   BMI 33.09 kg/m   General: NAD CV: RRR Pulm: CTABL, nl effort ABD: s/nd/nt, fundus firm and below the umbilicus Lochia: moderate Incision: c/d/i DVT Evaluation: LE non-ttp, no evidence of DVT on exam.  Hemoglobin  Date Value Ref Range Status  04/29/2020 9.2 (L) 12.0 - 15.0 g/dL Final   HGB  Date Value Ref Range Status  12/08/2011 12.1 12.0 - 16.0 g/dL Final   HCT  Date Value Ref Range Status  04/29/2020 27.1 (L) 36.0 - 46.0 % Final  12/10/2011 33.0 (L) 35.0 - 47.0 % Final     Disposition: stable, discharge to home. Baby Feeding: breastmilk Baby Disposition: home with mom  Rh Immune globulin given: n/a  Rubella vaccine given: n/a Tdap vaccine given in AP or PP setting: 03/26/20  Flu vaccine given in AP or PP setting: 03/12/20  Contraception: POPs/OCPs  Prenatal Labs:   ABO, Rh:  B+ Antibody:  neg Rubella:  Imm/ Varicella IMM RPR:   NR HBsAg:   neg  HIV:   neg  GBS:   neg  Plan:  RAMA SORCI was discharged to home in good condition. Follow-up appointment with delivering provider in 6 weeks.  Discharge Medications: Allergies as of 04/30/2020   No Known Allergies     Medication  List    STOP taking these medications   aspirin 81 MG EC tablet     TAKE these medications   ferrous sulfate 325 (65 FE) MG tablet Take 1 tablet (325 mg total) by mouth 2 (two) times daily with a meal. What changed: when to take this   oxyCODONE 5 MG immediate release tablet Commonly known as: Oxy IR/ROXICODONE Take 1-2 tablets (5-10 mg total) by mouth every 4 (four) hours as needed for moderate pain.   Prenatal (w/Iron & FA) 27-0.8 MG Tabs Take 1 tablet by mouth daily.   TUMS PO Take 1 tablet by mouth as needed.        Follow-up Information    [facility] Follow up.   Why: 2 week Post-op appt already scheduled Contact information: [address] [place] [address] [phone]              Signed: Cyril Mourning  04/30/2020 7:45 AM

## 2020-04-28 NOTE — Op Note (Signed)
NAME: Heidi Bonilla, Heidi Bonilla MEDICAL RECORD DE:[id number] ACCOUNT [id number] DATE OF BIRTH:1985/04/05 FACILITY: [facility] LOCATION: [facility] PHYSICIAN:Takota Cahalan Cloyde Reams, MD  OPERATIVE REPORT  DATE OF PROCEDURE:  04/28/2020  PREOPERATIVE DIAGNOSES: 1.  A 37 plus 3 weeks estimated gestational age. 2.  Elective repeat cesarean section.  POSTOPERATIVE DIAGNOSES: 1.  A 37 plus 3 weeks estimated gestational age. 2.  Elective repeat cesarean section. 3.  Vigorous female delivered.  PROCEDURE:  Repeat low transverse cesarean section.  ANESTHESIA:  Spinal.  SURGEON:  Jennell Corner, MD  FIRST ASSISTANT:  Margaretmary Eddy, certified nurse midwife.  INDICATIONS:  A 35 year old gravida 3, para 2 patient at 60 plus 3 weeks estimated gestational age.  The patient with 2 prior cesarean sections and has desired a repeat cesarean section.  DESCRIPTION OF PROCEDURE:  After adequate spinal anesthesia, the patient was placed in dorsal supine position, hip roll under the right side.  The patient's abdomen was prepped and draped in normal sterile fashion.  The patient did receive 2 grams IV  Ancef prior to commencement of the case.  Timeout was performed.  A Pfannenstiel incision was made 2 fingerbreadths above the symphysis pubis superior to the prior scar.  Sharp dissection was used to identify the fascia.  Fascia was opened in a  transverse fashion.  The superior aspect of the fascia was grasped with Kocher clamps and the recti muscles were dissected free.  Inferior aspect of the fascia was grasped with Kocher clamps and the pyramidalis muscle was dissected free.  Entry into the  peritoneal cavity was accomplished sharply.  The  vesicouterine peritoneal fold was identified, opened and the bladder was reflected inferiorly.  A low transverse uterine incision was made.  Upon entry into the endometrial cavity, clear fluid resulted.   The incision was extended with blunt transverse traction.  Fetal head was  brought to the incision and a Kiwi vacuum was applied to the fetal occiput and with one gentle pull, the head was delivered, the vacuum was removed and shoulders were then  delivered.  A vigorous female was dried on mother's abdomen for 60 seconds and cord was doubly clamped and vigorous female was passed to nursery staff who assigned Apgars scores of 9 and 9.  Time of birth 37 on 04/28/2020.  The placenta was manually  delivered and the uterus was exteriorized.  The endometrial cavity was wiped clean with laparotomy tape and the cervix was opened with a ring forceps.  Uterine incision was closed with 1 chromic suture in a running locking fashion.  Good approximation of  edges, good hemostasis was noted.  Fallopian tubes and ovaries appeared normal.  Posterior cul-de-sac was irrigated and suctioned for hemostasis and the uterus was placed back into the abdominal cavity.  The paracolic gutters were then wiped clean with  laparotomy tape.  Uterine incision again appeared hemostatic.  Interceed was placed over the uterine incision in a T-shaped fashion.  The fascia was then closed with 0 Vicryl suture in a running nonlocking fashion.  Two separate sutures were used.  The  fascial edges were then injected with a solution of 20 mL of 1.3% Exparel plus 30 mL of 0.5% Marcaine plus 50 mL normal saline; 50 mL of the solution was injected at the fascial edges.  Subcutaneous tissues were opened with the Bovie to reduce prior scar  tissue.  The subcutaneous tissue was then irrigated and bovied for hemostasis and the skin was reapproximated with staples, Insorb staples.  Good cosmetic effect noted.  Additional 30 mL of Exparel solution was injected beneath the skin.  COMPLICATIONS:  There were no complications.  ESTIMATED BLOOD LOSS:  Quantitative blood loss 400 mL.  INTRAOPERATIVE FLUIDS:  600 mL.  DISPOSITION:  The patient was taken to recovery room in good condition.  HN/NUANCE  D:04/28/2020 T:04/28/2020  JOB:[id number]

## 2020-04-28 NOTE — Anesthesia Procedure Notes (Signed)
Spinal  Patient location during procedure: OR Start time: 04/28/2020 8:00 AM End time: 04/28/2020 8:06 AM Staffing Resident/CRNA: Tollie Eth, CRNA Other anesthesia staff: Merryl Hacker, RN Preanesthetic Checklist Completed: patient identified, IV checked, site marked, risks and benefits discussed, surgical consent, monitors and equipment checked, pre-op evaluation and timeout performed Spinal Block Patient position: sitting Prep: Betadine Patient monitoring: heart rate, continuous pulse ox, blood pressure and cardiac monitor Approach: midline Location: L3-4 Injection technique: single-shot Needle Needle type: Introducer and Pencan  Needle gauge: 24 G Needle length: 5 cm Assessment Sensory level: T4 Additional Notes Negative paresthesia. Negative blood return. Positive free-flowing CSF. Expiration date of kit checked and confirmed. Patient tolerated procedure well, without complications.

## 2020-04-29 ENCOUNTER — Encounter: Payer: Self-pay | Admitting: Obstetrics and Gynecology

## 2020-04-29 LAB — CBC
HCT: 27.1 % — ABNORMAL LOW (ref 36.0–46.0)
Hemoglobin: 9.2 g/dL — ABNORMAL LOW (ref 12.0–15.0)
MCH: 29.7 pg (ref 26.0–34.0)
MCHC: 33.9 g/dL (ref 30.0–36.0)
MCV: 87.4 fL (ref 80.0–100.0)
Platelets: 111 10*3/uL — ABNORMAL LOW (ref 150–400)
RBC: 3.1 MIL/uL — ABNORMAL LOW (ref 3.87–5.11)
RDW: 12.8 % (ref 11.5–15.5)
WBC: 10.1 10*3/uL (ref 4.0–10.5)
nRBC: 0 % (ref 0.0–0.2)

## 2020-04-29 MED ORDER — ACETAMINOPHEN 500 MG PO TABS
1000.0000 mg | ORAL_TABLET | Freq: Four times a day (QID) | ORAL | Status: DC
  Administered 2020-04-29 – 2020-04-30 (×3): 1000 mg via ORAL
  Filled 2020-04-29 (×3): qty 2

## 2020-04-29 MED ORDER — IBUPROFEN 800 MG PO TABS
800.0000 mg | ORAL_TABLET | Freq: Four times a day (QID) | ORAL | Status: DC
  Administered 2020-04-29 – 2020-04-30 (×3): 800 mg via ORAL
  Filled 2020-04-29 (×3): qty 1

## 2020-04-29 NOTE — Anesthesia Postprocedure Evaluation (Signed)
Anesthesia Post Note  Patient: Heidi Bonilla  Procedure(s) Performed: REPEAT CESAREAN SECTION (N/A )  Patient location during evaluation: Mother Baby Anesthesia Type: Spinal Level of consciousness: oriented and awake and alert Pain management: pain level controlled Vital Signs Assessment: post-procedure vital signs reviewed and stable Respiratory status: spontaneous breathing and respiratory function stable Cardiovascular status: blood pressure returned to baseline and stable Postop Assessment: no headache, no backache, no apparent nausea or vomiting and able to ambulate Anesthetic complications: no   No complications documented.   Last Vitals:  Vitals:   04/28/20 2316 04/29/20 0000  BP: 107/83   Pulse: 67 63  Resp: 18   Temp: 36.7 C   SpO2: 98% 95%    Last Pain:  Vitals:   04/28/20 2316  TempSrc: Oral  PainSc:                  Elmarie Mainland

## 2020-04-29 NOTE — Anesthesia Post-op Follow-up Note (Signed)
  Anesthesia Pain Follow-up Note  Patient: Heidi Bonilla  Day #: 1  Date of Follow-up: 04/29/2020 Time: 8:24 AM  Last Vitals:  Vitals:   04/28/20 2316 04/29/20 0000  BP: 107/83   Pulse: 67 63  Resp: 18   Temp: 36.7 C   SpO2: 98% 95%    Level of Consciousness: alert  Pain: none   Side Effects:None  Catheter Site Exam:clean, dry, no drainage  Anti-Coag Meds (From admission, onward)   Start     Dose/Rate Route Frequency Ordered Stop   04/29/20 1000  enoxaparin (LOVENOX) injection 40 mg        40 mg Subcutaneous Every 24 hours 04/28/20 1211         Plan: D/C from anesthesia care at surgeon's request  Avery Eustice B Alonza Smoker

## 2020-04-30 DIAGNOSIS — Z98891 History of uterine scar from previous surgery: Secondary | ICD-10-CM

## 2020-04-30 DIAGNOSIS — D62 Acute posthemorrhagic anemia: Secondary | ICD-10-CM | POA: Diagnosis not present

## 2020-04-30 MED ORDER — OXYCODONE HCL 5 MG PO TABS
5.0000 mg | ORAL_TABLET | ORAL | 0 refills | Status: DC | PRN
Start: 2020-04-30 — End: 2023-06-20

## 2020-04-30 MED ORDER — FERROUS SULFATE 325 (65 FE) MG PO TABS: 325.0000 mg | ORAL_TABLET | Freq: Two times a day (BID) | ORAL | 3 refills | Status: DC

## 2020-04-30 NOTE — Progress Notes (Signed)
Discharge instructions, follow up and prescriptions reviewed with patient who verbalized understanding.  Patient discharged home with infant.

## 2020-04-30 NOTE — Discharge Instructions (Signed)
  Postpartum Care After Cesarean Delivery This sheet gives you information about how to care for yourself from the time you deliver your baby to up to 6-12 weeks after delivery (postpartum period). Your health care provider may also give you more specific instructions. If you have problems or questions, contact your health care provider. Follow these instructions at home: Medicines  Take over-the-counter and prescription medicines only as told by your health care provider.  If you were prescribed an antibiotic medicine, take it as told by your health care provider. Do not stop taking the antibiotic even if you start to feel better.  Ask your health care provider if the medicine prescribed to you: ? Requires you to avoid driving or using heavy machinery. ? Can cause constipation. You may need to take actions to prevent or treat constipation, such as:  Drink enough fluid to keep your urine pale yellow.  Take over-the-counter or prescription medicines.  Eat foods that are high in fiber, such as beans, whole grains, and fresh fruits and vegetables.  Limit foods that are high in fat and processed sugars, such as fried or sweet foods. Activity  Gradually return to your normal activities as told by your health care provider.  Avoid activities that take a lot of effort and energy (are strenuous) until approved by your health care provider. Walking at a slow to moderate pace is usually safe. Ask your health care provider what activities are safe for you. ? Do not lift anything that is heavier than your baby or 10 lb (4.5 kg) as told by your health care provider. ? Do not vacuum, climb stairs, or drive a car for as long as told by your health care provider.  If possible, have someone help you at home until you are able to do your usual activities yourself.  Rest as much as possible. Try to rest or take naps while your baby is sleeping. Vaginal bleeding  It is normal to have vaginal  bleeding (lochia) after delivery. Wear a sanitary pad to absorb vaginal bleeding and discharge. ? During the first week after delivery, the amount and appearance of lochia is often similar to a menstrual period. ? Over the next few weeks, it will gradually decrease to a dry, yellow-brown discharge. ? For most women, lochia stops completely by 4-6 weeks after delivery. Vaginal bleeding can vary from woman to woman.  Change your sanitary pads frequently. Watch for any changes in your flow, such as: ? A sudden increase in volume. ? A change in color. ? Large blood clots.  If you pass a blood clot, save it and call your health care provider to discuss. Do not flush blood clots down the toilet before you get instructions from your health care provider.  Do not use tampons or douches until your health care provider says this is safe.  If you are not breastfeeding, your period should return 6-8 weeks after delivery. If you are breastfeeding, your period may return anytime between 8 weeks after delivery and the time that you stop breastfeeding. Perineal care  If your C-section (Cesarean section) was unplanned, and you were allowed to labor and push before delivery, you may have pain, swelling, and discomfort of the tissue between your vaginal opening and your anus (perineum). You may also have an incision in the tissue (episiotomy) or the tissue may have torn during delivery. Follow these instructions as told by your health care provider: ? Keep your perineum clean and dry as told   by your health care provider. Use medicated pads and pain-relieving sprays and creams as directed. ? If you have an episiotomy or vaginal tear, check the area every day for signs of infection. Check for:  Redness, swelling, or pain.  Fluid or blood.  Warmth.  Pus or a bad smell. ? You may be given a squirt bottle to use instead of wiping to clean the perineum area after you go to the bathroom. As you start healing, you  may use the squirt bottle before wiping yourself. Make sure to wipe gently. ? To relieve pain caused by an episiotomy, vaginal tear, or hemorrhoids, try taking a warm sitz bath 2-3 times a day. A sitz bath is a warm water bath that is taken while you are sitting down. The water should only come up to your hips and should cover your buttocks.   Breast care  Within the first few days after delivery, your breasts may feel heavy, full, and uncomfortable (breast engorgement). You may also have milk leaking from your breasts. Your health care provider can suggest ways to help relieve breast discomfort. Breast engorgement should go away within a few days.  If you are breastfeeding: ? Wear a bra that supports your breasts and fits you well. ? Keep your nipples clean and dry. Apply creams and ointments as told by your health care provider. ? You may need to use breast pads to absorb milk leakage. ? You may have uterine contractions every time you breastfeed for several weeks after delivery. Uterine contractions help your uterus return to its normal size. ? If you have any problems with breastfeeding, work with your health care provider or a lactation consultant.  If you are not breastfeeding: ? Avoid touching your breasts as this can make your breasts produce more milk. ? Wear a well-fitting bra and use cold packs to help with swelling. ? Do not squeeze out (express) milk. This causes you to make more milk. Intimacy and sexuality  Ask your health care provider when you can engage in sexual activity. This may depend on your: ? Risk of infection. ? Healing rate. ? Comfort and desire to engage in sexual activity.  You are able to get pregnant after delivery, even if you have not had your period. If desired, talk with your health care provider about methods of family planning or birth control (contraception). Lifestyle  Do not use any products that contain nicotine or tobacco, such as cigarettes,  e-cigarettes, and chewing tobacco. If you need help quitting, ask your health care provider.  Do not drink alcohol, especially if you are breastfeeding. Eating and drinking  Drink enough fluid to keep your urine pale yellow.  Eat high-fiber foods every day. These may help prevent or relieve constipation. High-fiber foods include: ? Whole grain cereals and breads. ? Brown rice. ? Beans. ? Fresh fruits and vegetables.  Take your prenatal vitamins until your postpartum checkup or until your health care provider tells you it is okay to stop.   General instructions  Keep all follow-up visits for you and your baby as told by your health care provider. Most women visit their health care provider for a postpartum checkup within the first 3-6 weeks after delivery. Contact a health care provider if you:  Feel unable to cope with the changes that a new baby brings to your life, and these feelings do not go away.  Feel unusually sad or worried.  Have breasts that are painful, hard, or turn red.    Have a fever.  Have trouble holding urine or keeping urine from leaking.  Have little or no interest in activities you used to enjoy.  Have not breastfed at all and you have not had a menstrual period for 12 weeks after delivery.  Have stopped breastfeeding and you have not had a menstrual period for 12 weeks after you stopped breastfeeding.  Have questions about caring for yourself or your baby.  Pass a blood clot from your vagina. Get help right away if you:  Have chest pain.  Have difficulty breathing.  Have sudden, severe leg pain.  Have severe pain or cramping in your abdomen.  Bleed from your vagina so much that you fill more than one sanitary pad in one hour. Bleeding should not be heavier than your heaviest period.  Develop a severe headache.  Faint.  Have blurred vision or spots in your vision.  Have a bad-smelling vaginal discharge.  Have thoughts about hurting yourself  or your baby. If you ever feel like you may hurt yourself or others, or have thoughts about taking your own life, get help right away. You can go to your nearest emergency department or call:  Your local emergency services (911 in the [place]  A suicide crisis helpline, such as the National Suicide Prevention Lifeline at 1-800-273-8255. This is open 24 hours a day. Summary  The period of time from when you deliver your baby to up to 6-12 weeks after delivery is called the postpartum period.  Gradually return to your normal activities as told by your health care provider.  Keep all follow-up visits for you and your baby as told by your health care provider. This information is not intended to replace advice given to you by your health care provider. Make sure you discuss any questions you have with your health care provider. Document Revised: 10/18/2017 Document Reviewed: 10/18/2017 Elsevier Patient Education  2021 Elsevier Inc.   Postpartum Baby Blues The postpartum period begins right after the birth of a baby. During this time, there is often joy and excitement. It is also a time of many changes in the life of the parents. A mother may feel happy one minute and sad or stressed the next. These feelings of sadness, called the baby blues, usually happen in the period right after the baby is born and go away within a week or two. What are the causes? The exact cause of this condition is not known. Changes in hormone levels after childbirth are believed to trigger some of the symptoms. Other factors that can play a role in these mood changes include:  Lack of sleep.  Stressful life events, such as financial problems, caring for a loved one, or death of a loved one.  Genetics. What are the signs or symptoms? Symptoms of this condition include:  Changes in mood, such as going from extreme happiness to sadness.  A decrease in concentration.  Difficulty sleeping.  Crying spells and  tearfulness.  Loss of appetite.  Irritability.  Anxiety. If these symptoms last for more than 2 weeks or become more severe, you may have postpartum depression. How is this diagnosed? This condition is diagnosed based on an evaluation of your symptoms. Your health care provider may use a screening tool that includes a list of questions to help identify a person with the baby blues or postpartum depression. How is this treated? The baby blues usually go away on their own in 1-2 weeks. Social support is often what is needed.   You will be encouraged to get adequate sleep and rest. Follow these instructions at home: Lifestyle  Get as much rest as you can. Take a nap when the baby sleeps.  Exercise regularly as told by your health care provider. Some women find yoga and walking to be helpful.  Eat a balanced and nourishing diet. This includes plenty of fruits and vegetables, whole grains, and lean proteins.  Do little things that you enjoy. Take a bubble bath, read your favorite magazine, or listen to your favorite music.  Avoid alcohol.  Ask for help with household chores, cooking, grocery shopping, or running errands. Do not try to do everything yourself. Consider hiring a postpartum doula to help. This is a professional who specializes in providing support to new mothers.  Try not to make any major life changes during pregnancy or right after giving birth. This can add stress.      General instructions  Talk to people close to you about how you are feeling. Get support from your partner, family members, friends, or other new moms. You may want to join a support group.  Find ways to manage stress. This may include: ? Writing your thoughts and feelings in a journal. ? Spending time outside. ? Spending time with people who make you laugh.  Try to stay positive in how you think. Think about the things you are grateful for.  Take over-the-counter and prescription medicines only as  told by your health care provider.  Let your health care provider know if you have any concerns.  Keep all postpartum visits. This is important. Contact a health care provider if:  Your baby blues do not go away after 2 weeks. Get help right away if:  You have thoughts of taking your own life (suicidal thoughts), or of harming your baby or someone else.  You see or hear things that are not there (hallucinations). If you ever feel like you may hurt yourself or others, or have thoughts about taking your own life, get help right away. Go to your nearest emergency department or:  Call your local emergency services (911 in the [place]  Call a suicide crisis helpline, such as the National Suicide Prevention Lifeline, at 1-800-273-8255. This is open 24 hours a day in the [place]  Text the Crisis Text Line at 741741 (in the [place] Summary  After giving birth, you may feel happy one minute and sad or stressed the next. Feelings of sadness that happen right after the baby is born and go away after a week or two are called the baby blues.  You can manage the baby blues by getting enough rest, eating a healthy diet, exercising, spending time with supportive people, and finding ways to manage stress.  If feelings of sadness and stress last longer than 2 weeks or get in the way of caring for your baby, talk with your health care provider. This may mean you have postpartum depression. This information is not intended to replace advice given to you by your health care provider. Make sure you discuss any questions you have with your health care provider. Document Revised: 08/23/2019 Document Reviewed: 08/23/2019 Elsevier Patient Education  2021 Elsevier Inc.  

## 2021-12-23 ENCOUNTER — Ambulatory Visit: Payer: Self-pay

## 2021-12-23 ENCOUNTER — Ambulatory Visit: Payer: Self-pay | Admitting: *Deleted

## 2021-12-23 NOTE — Telephone Encounter (Signed)
  Chief Complaint: calf pain Symptoms: feels knot Frequency: 5 weeks Pertinent Negatives: Patient denies SOB Disposition: [] ED /[x] Urgent Care (no appt availability in office) / [] Appointment(In office/virtual)/ []  [facility]/ [] Home Care/ [] Refused Recommended Disposition /[] [facility]/ []  Follow-up with PCP Additional Notes: Pt has not been seen in private practice in almost 4 years. Advised to go to UC and make a new pt appt.   Reason for Disposition  Localized pain, redness or hard lump along vein  Answer Assessment - Initial Assessment Questions 1. ONSET: "When did the pain start?"      5 weeks 2. LOCATION: "Where is the pain located?"      Right calf 3. PAIN: "How bad is the pain?"    (Scale 1-10; or mild, moderate, severe)   -  MILD (1-3): doesn't interfere with normal activities    -  MODERATE (4-7): interferes with normal activities (e.g., work or school) or awakens from sleep, limping    -  SEVERE (8-10): excruciating pain, unable to do any normal activities, unable to walk     medium 4. 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 CAUSE: "What do you think is causing the leg pain?"     Wonders if blood clot 6. OTHER SYMPTOMS: "Do you have any other symptoms?" (e.g., chest pain, back pain, breathing difficulty, swelling, rash, fever, numbness, weakness)     no 7. PREGNANCY: "Is there any chance you are pregnant?" "When was your last menstrual period?"     no  Protocols used: Leg Pain-A-AH

## 2021-12-24 ENCOUNTER — Ambulatory Visit: Payer: BC Managed Care – PPO

## 2021-12-24 ENCOUNTER — Ambulatory Visit
Admission: RE | Admit: 2021-12-24 | Discharge: 2021-12-24 | Disposition: A | Discharge status: Home / Self Care | Payer: BC Managed Care – PPO | Source: Ambulatory Visit

## 2021-12-24 VITALS — BP 128/78 | HR 63 | Temp 98.4°F | Resp 16 | Ht 66.0 in | Wt 190.0 lb

## 2021-12-24 DIAGNOSIS — M7989 Other specified soft tissue disorders: Secondary | ICD-10-CM

## 2021-12-24 NOTE — ED Triage Notes (Signed)
Pt c/o pain along the back of the right calf x72month.  Pt is worried about a possible blood clot.

## 2021-12-24 NOTE — ED Provider Notes (Signed)
[facility]    CSN: [id number] Arrival date & time: 12/24/21  1048      History   Chief Complaint Chief Complaint  Patient presents with   Leg Pain    Pain in my calf for 4 1/2 weeks.  Seems moderately swollen. Just want to confirm it isn't a blood clot. - Entered by patient    HPI Heidi Bonilla is a 36 y.o. female.   Patient presents with persistent right calf pain and swelling for 4 weeks.  For the last 3 weeks symptoms have been intermittent but over the past week they have become constant beginning immediately upon awakening.  Pain can be felt with walking and when leg is bent there is a pulling sensation.  Initially thought symptoms were a cramp because she was on her feet all day at a conference.  Has not attempted treatment.  Denies numbness, tingling, redness, injury or trauma.  Denies cardiac history.  Endorses symptoms have somewhat improved but have not resolved.  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, antepartum 12/03/2014   Vaginitis affecting pregnancy in third trimester, antepartum 12/03/2014   Indication for care in labor or delivery 11/24/2014    Past Surgical History:  Procedure Laterality Date   CESAREAN SECTION N/A 2013   CESAREAN SECTION N/A 02/09/2015   Procedure: CESAREAN SECTION;  Surgeon: Suzy Bouchard, MD;  Location: [facility] OR[facility];  Service: Obstetrics;  Laterality:  N/A;   CESAREAN SECTION N/A 04/28/2020   Procedure: REPEAT CESAREAN SECTION;  Surgeon: Feliberto Gottron, Ihor Austin, MD;  Location: [facility] OR[facility];  Service: Obstetrics;  Laterality: N/A;   TONSILLECTOMY  1992   WISDOM TOOTH EXTRACTION      OB History     Gravida  3   Para  3   Term  3   Preterm      AB      Living  3      SAB      IAB      Ectopic      Multiple  0   Live Births  2            Home Medications    Prior to Admission medications   Medication Sig Start Date End Date Taking? Authorizing Provider  norethindrone (MICRONOR) 0.35 MG tablet Take 1 tablet by mouth daily. 12/20/21  Yes [provider]  Calcium Carbonate Antacid (TUMS PO) Take 1 tablet by mouth as needed.    [provider]  ferrous sulfate 325 (65 FE) MG tablet Take 1 tablet (325 mg total) by mouth 2 (two) times daily with a meal. 04/30/20   Haroldine Laws, CNM  oxyCODONE (OXY IR/ROXICODONE) 5 MG immediate release tablet Take 1-2 tablets (5-10 mg total) by mouth every 4 (four) hours as needed for moderate pain. 04/30/20   Haroldine Laws, CNM  Prenatal Multivit-Min-Fe-FA (PRENATAL, W/IRON & FA,) 27-0.8 MG TABS Take 1 tablet  by mouth daily.    [provider]    Family History History reviewed. No pertinent family history.  Social History Social History   Tobacco Use   Smoking status: Never   Smokeless tobacco: Never  Vaping Use   Vaping Use: Never used  Substance Use Topics   Alcohol use: Not Currently    Comment: occassional use prior to pregnancy   Drug use: No     Allergies   Patient has no known allergies.   Review of Systems Review of Systems  Constitutional: Negative.   Respiratory: Negative.    Cardiovascular:  Positive for chest pain and leg swelling. Negative for palpitations.  Skin: Negative.   Neurological: Negative.   Psychiatric/Behavioral:  Positive for hallucinations.      Physical Exam Triage Vital Signs ED Triage Vitals  Enc Vitals  Group     BP 12/24/21 1059 128/78     Pulse Rate 12/24/21 1059 63     Resp 12/24/21 1059 16     Temp 12/24/21 1059 98.4 F (36.9 C)     Temp Source 12/24/21 1059 Oral     SpO2 12/24/21 1059 100 %     Weight 12/24/21 1058 190 lb (86.2 kg)     Height 12/24/21 1058 5\' 6"  (1.676 m)     Head Circumference --      Peak Flow --      Pain Score 12/24/21 1057 6     Pain Loc --      Pain Edu? --      Excl. in [place]? --    No data found.  Updated Vital Signs BP 128/78 (BP Location: Left Arm)   Pulse 63   Temp 98.4 F (36.9 C) (Oral)   Resp 16   Ht 5\' 6"  (1.676 m)   Wt 190 lb (86.2 kg)   LMP 12/24/2021   SpO2 100%   BMI 30.67 kg/m   Visual Acuity Right Eye Distance:   Left Eye Distance:   Bilateral Distance:    Right Eye Near:   Left Eye Near:    Bilateral Near:     Physical Exam Constitutional:      Appearance: Normal appearance.  HENT:     Head: Normocephalic.  Eyes:     Extraocular Movements: Extraocular movements intact.  Pulmonary:     Effort: Pulmonary effort is normal.  Musculoskeletal:     Comments: Mild swelling to the right calf without ecchymosis or erythema, point tenderness to the center of the calf muscle, calf measuring at 15.5 in comparison to left which is 15, able to bear weight, 2+ popliteal and dorsalis pedis pulse, sensation intact  Skin:    General: Skin is warm and dry.  Neurological:     Mental Status: She is alert and oriented to person, place, and time.  Psychiatric:        Mood and Affect: Mood normal.        Behavior: Behavior normal.      UC Treatments / Results  Labs (all labs ordered are listed, but only abnormal results are displayed) Labs Reviewed - No data to display  EKG   Radiology No results found.  Procedures Procedures (including critical care time)  Medications Ordered in UC Medications - No data to display  Initial Impression / Assessment and Plan / UC Course  I have reviewed the triage vital signs and the  nursing notes.  Pertinent labs & imaging results that were available during my care of  the patient were reviewed by me and considered in my medical decision making (see chart for details).  Right leg swelling  As symptoms have persisted for 4 weeks without resolution we will rule out blood clot given the patient is a low risk, ultrasound pending Final Clinical Impressions(s) / UC Diagnoses   Final diagnoses:  None   Discharge Instructions   None    ED Prescriptions   None    PDMP not reviewed this encounter.   Valinda Hoar, NP 12/24/21 1131

## 2021-12-24 NOTE — Discharge Instructions (Signed)
Please go to the medical mall at [facility] hospital to complete an ultrasound to rule out a blood clot, ideally arrived 10 minutes prior to appointment  Appointment is scheduled for 1 PM, you may return home after ultrasound is completed, once I receive the results I will call you and we will discuss how to move forward

## 2022-05-13 ENCOUNTER — Encounter: Payer: Self-pay | Admitting: "Endocrinology

## 2022-05-13 ENCOUNTER — Other Ambulatory Visit: Payer: Self-pay

## 2022-05-13 DIAGNOSIS — Z1231 Encounter for screening mammogram for malignant neoplasm of breast: Secondary | ICD-10-CM

## 2022-06-13 ENCOUNTER — Ambulatory Visit
Admission: RE | Admit: 2022-06-13 | Discharge: 2022-06-13 | Disposition: A | Discharge status: Home / Self Care | Payer: BC Managed Care – PPO | Source: Ambulatory Visit | Attending: Obstetrics and Gynecology | Admitting: Obstetrics and Gynecology

## 2022-06-13 DIAGNOSIS — Z1231 Encounter for screening mammogram for malignant neoplasm of breast: Secondary | ICD-10-CM | POA: Diagnosis not present

## 2022-07-19 ENCOUNTER — Ambulatory Visit: Payer: BC Managed Care – PPO | Admitting: Family Medicine

## 2023-06-20 ENCOUNTER — Ambulatory Visit: Payer: Self-pay | Admitting: Internal Medicine

## 2023-06-20 ENCOUNTER — Encounter: Payer: Self-pay | Admitting: Internal Medicine

## 2023-06-20 VITALS — BP 124/74 | Ht 66.0 in | Wt 199.0 lb

## 2023-06-20 DIAGNOSIS — K219 Gastro-esophageal reflux disease without esophagitis: Secondary | ICD-10-CM | POA: Diagnosis not present

## 2023-06-20 DIAGNOSIS — E6609 Other obesity due to excess calories: Secondary | ICD-10-CM

## 2023-06-20 DIAGNOSIS — E66811 Obesity, class 1: Secondary | ICD-10-CM | POA: Insufficient documentation

## 2023-06-20 DIAGNOSIS — F411 Generalized anxiety disorder: Secondary | ICD-10-CM | POA: Insufficient documentation

## 2023-06-20 DIAGNOSIS — Z6832 Body mass index (BMI) 32.0-32.9, adult: Secondary | ICD-10-CM

## 2023-06-20 NOTE — Assessment & Plan Note (Signed)
 Encouraged diet and exercise for weight loss ?

## 2023-06-20 NOTE — Progress Notes (Signed)
 Subjective:    Patient ID: Heidi Bonilla, female    DOB: [birth year], 38 y.o.   MRN: [id number]  HPI  Patient presents to clinic today to establish care and for management of the conditions listed below  GERD: Triggered by eating too late, [place] food. She does not take anything for this. There is no upper GI on file.  Anxiety: Chronic, managed on escitalopram. She is not currently seeing a therapist but works with a Set designer. She denies depression, SI/HI.  Review of Systems   Past Medical History:  Diagnosis Date   Anemia    Complication of anesthesia 2016   last c-section was unable to recall anything during procedure   GERD (gastroesophageal reflux disease)    only during pregnancy    Current Outpatient Medications  Medication Sig Dispense Refill   Calcium Carbonate Antacid (TUMS PO) Take 1 tablet by mouth as needed.     ferrous sulfate 325 (65 FE) MG tablet Take 1 tablet (325 mg total) by mouth 2 (two) times daily with a meal.  3   norethindrone (MICRONOR) 0.35 MG tablet Take 1 tablet by mouth daily.     oxyCODONE (OXY IR/ROXICODONE) 5 MG immediate release tablet Take 1-2 tablets (5-10 mg total) by mouth every 4 (four) hours as needed for moderate pain. 30 tablet 0   Prenatal Multivit-Min-Fe-FA (PRENATAL, W/IRON & FA,) 27-0.8 MG TABS Take 1 tablet by mouth daily.     No current facility-administered medications for this visit.    No Known Allergies  No family history on file.  Social History   Socioeconomic History   Marital status: Married    Spouse name: Christiane Ha   Number of children: 3   Years of education: Not on file   Highest education level: Not on file  Occupational History   Not on file  Tobacco Use   Smoking status: Never   Smokeless tobacco: Never  Vaping Use   Vaping status: Never Used  Substance and Sexual Activity   Alcohol use: Not Currently    Comment: occassional use prior to pregnancy   Drug use: No   Sexual activity: Yes   Other Topics Concern   Not on file  Social History Narrative   Not on file   Social Drivers of Health   Financial Resource Strain: Not on file  Food Insecurity: Not on file  Transportation Needs: Not on file  Physical Activity: Not on file  Stress: Not on file  Social Connections: Not on file  Intimate Partner Violence: Not on file     Constitutional: Denies fever, malaise, fatigue, headache or abrupt weight changes.  HEENT: Denies eye pain, eye redness, ear pain, ringing in the ears, wax buildup, runny nose, nasal congestion, bloody nose, or sore throat. Respiratory: Denies difficulty breathing, shortness of breath, cough or sputum production.   Cardiovascular: Denies chest pain, chest tightness, palpitations or swelling in the hands or feet.  Gastrointestinal: Patient reports intermittent reflux.  Denies abdominal pain, bloating, constipation, diarrhea or blood in the stool.  GU: Denies urgency, frequency, pain with urination, burning sensation, blood in urine, odor or discharge. Musculoskeletal: Denies decrease in range of motion, difficulty with gait, muscle pain or joint pain and swelling.  Skin: Denies redness, rashes, lesions or ulcercations.  Neurological: Denies dizziness, difficulty with memory, difficulty with speech or problems with balance and coordination.  Psych: Patient has a history of anxiety.  Denies depression, SI/HI.  No other specific complaints in a complete review  of systems (except as listed in HPI above).      Objective:   Physical Exam  BP 124/74 (BP Location: Left Arm, Patient Position: Sitting, Cuff Size: Normal)   Ht 5\' 6"  (1.676 m)   Wt 199 lb (90.3 kg)   LMP 06/01/2023 (Approximate)   BMI 32.12 kg/m   Wt Readings from Last 3 Encounters:  12/24/21 190 lb (86.2 kg)  04/28/20 205 lb (93 kg)  04/23/20 201 lb (91.2 kg)    General: Appears her stated age, obese, in NAD. Skin: Warm, dry and intact.  HEENT: Head: normal shape and size; Eyes:  sclera white, no icterus, conjunctiva pink, PERRLA and EOMs intact;  Cardiovascular: Normal rate and rhythm. S1,S2 noted.  No murmur, rubs or gallops noted. No JVD or BLE edema.  Pulmonary/Chest: Normal effort and positive vesicular breath sounds. No respiratory distress. No wheezes, rales or ronchi noted.  Musculoskeletal: No difficulty with gait.  Neurological: Alert and oriented.  Coordination normal.  Psychiatric: Mood and affect normal. Behavior is normal. Judgment and thought content normal.    BMET    Component Value Date/Time   NA 136 04/27/2020 0914   K 3.7 04/27/2020 0914   CL 105 04/27/2020 0914   CO2 22 04/27/2020 0914   GLUCOSE 100 (H) 04/27/2020 0914   BUN 7 04/27/2020 0914   CREATININE 0.54 04/27/2020 0914   CREATININE 0.86 11/13/2015 1556   CALCIUM 8.9 04/27/2020 0914   GFRNONAA >60 04/27/2020 0914   GFRNONAA >89 11/13/2015 1556   GFRAA >89 11/13/2015 1556    Lipid Panel     Component Value Date/Time   CHOL 163 11/13/2015 1556   TRIG 68 11/13/2015 1556   HDL 73 11/13/2015 1556   CHOLHDL 2.2 11/13/2015 1556   VLDL 14 11/13/2015 1556   LDLCALC 76 11/13/2015 1556    CBC    Component Value Date/Time   WBC 10.1 04/29/2020 0424   RBC 3.10 (L) 04/29/2020 0424   HGB 9.2 (L) 04/29/2020 0424   HGB 12.1 12/08/2011 2146   HCT 27.1 (L) 04/29/2020 0424   HCT 33.0 (L) 12/10/2011 0510   PLT 111 (L) 04/29/2020 0424   PLT 139 (L) 12/09/2011 1238   MCV 87.4 04/29/2020 0424   MCV 87 12/08/2011 2146   MCH 29.7 04/29/2020 0424   MCHC 33.9 04/29/2020 0424   RDW 12.8 04/29/2020 0424   RDW 12.8 12/08/2011 2146   LYMPHSABS 2,016 11/13/2015 1556   LYMPHSABS 2.0 12/08/2011 2146   MONOABS 392 11/13/2015 1556   MONOABS 0.6 12/08/2011 2146   EOSABS 56 11/13/2015 1556   EOSABS 0.1 12/08/2011 2146   BASOSABS 56 11/13/2015 1556   BASOSABS 0.1 12/08/2011 2146    Hgb A1C No results found for: "HGBA1C"          Assessment & Plan:    RTC in 6 months for your  annual exam Nicki Reaper, NP

## 2023-06-20 NOTE — Assessment & Plan Note (Signed)
Stable on her current dose of escitalopram Support offered

## 2023-06-20 NOTE — Assessment & Plan Note (Signed)
 Avoid foods that trigger reflux Encourage weight loss as this can produce reflux symptoms Okay to take Tums or Rolaids OTC if needed

## 2023-06-20 NOTE — Patient Instructions (Signed)
 GERD in Adults: Diet Changes When you have gastroesophageal reflux disease (GERD), you may need to make changes to your diet. Choosing the right foods can help with your symptoms. Think about working with an expert in healthy eating called a dietitian. They can help you make healthy food choices. What are tips for following this plan? Reading food labels Look for foods that are low in saturated fat. Foods that may help with your symptoms include: Foods with less than 5% of daily value (DV) of fat. Foods with 0 grams of trans fat. Cooking [employer] in ways that don't use a lot of fat. These ways include: Baking. Steaming. Grilling. Broiling. To add flavor, try to use herbs that are low in spice and acidity. Avoid frying your food. Meal planning  Eat small meals often rather than eating 3 large meals each day. Eat your meals slowly in a place where you feel relaxed. If told by your health care provider, avoid: Foods that cause symptoms. Keep a food diary to keep track of foods that cause symptoms. Alcohol. Drinking a lot of liquid with meals. General instructions For 2-3 hours after you eat, avoid: Bending over. Exercise. Lying down. Chew sugar-free gum after meals. What foods should I eat? Eat a healthy diet. Try to include: Foods with high amounts of fiber. These include: Fruits and vegetables. Whole grains and beans. Low-fat dairy products. Lean meats, fish, and poultry. Egg whites. Foods that cause symptoms in someone else may not cause symptoms for you. Work with your provider to find foods that are safe for you. The items listed above may not be all the foods and drinks you can have. Talk with a dietitian to learn more. The items listed above may not be a complete list of foods and beverages you can eat and drink. Contact a dietitian for more information. What foods should I avoid? Limiting some of these foods may help with your symptoms. Each person is different.  Talk with a dietitian or your provider to help you find the exact foods to avoid. Some of the foods to avoid may include: Fruits Fruits with a lot of acid in them. These may include citrus fruits, such as oranges, grapefruit, pineapple, and lemons. Vegetables Deep-fried vegetables, such as [place] fries. Vegetables, sauces, or toppings made with added fat and vegetables with acid in them. These may include tomatoes and tomato products, chili peppers, onions, garlic, and horseradish. Grains Pastries or quick breads with added fat. Meats and other proteins High-fat meats, such as fatty beef or pork, hot dogs, ribs, ham, sausage, salami, and bacon. Fried meat or protein, such as fried fish and fried chicken. Egg yolks. Fats and oils Butter. Margarine. Shortening. Ghee. Drinks Coffee and other drinks with caffeine in them. Fizzy and sugary drinks, such as soda and energy drinks. Fruit juice made with acidic fruits, such as orange or grapefruit. Tomato juice. Sweets and desserts Chocolate and cocoa. Donuts. Seasonings and condiments Mint, such as peppermint and spearmint. Condiments, herbs, or seasonings that cause symptoms. These may include curry, hot sauce, or vinegar-based salad dressings. The items listed above may not be all the foods and drinks you should avoid. Talk with a dietitian to learn more. Questions to ask your health care provider Changes to your diet and everyday life are often the first steps taken to manage symptoms of GERD. If these changes don't help, talk with your provider about taking medicines. Where to find more information International Foundation for Gastrointestinal Disorders:  aboutgerd.org This information is not intended to replace advice given to you by your health care provider. Make sure you discuss any questions you have with your health care provider. Document Revised: 01/10/2023 Document Reviewed: 07/27/2022 Elsevier Patient Education  2024 [employer].

## 2023-07-05 ENCOUNTER — Other Ambulatory Visit: Payer: Self-pay | Admitting: Internal Medicine

## 2023-07-05 DIAGNOSIS — Z1231 Encounter for screening mammogram for malignant neoplasm of breast: Secondary | ICD-10-CM

## 2023-08-08 ENCOUNTER — Ambulatory Visit
Admission: RE | Admit: 2023-08-08 | Discharge: 2023-08-08 | Disposition: A | Discharge status: Home / Self Care | Source: Ambulatory Visit | Attending: Internal Medicine | Admitting: Internal Medicine

## 2023-08-08 DIAGNOSIS — Z1231 Encounter for screening mammogram for malignant neoplasm of breast: Secondary | ICD-10-CM | POA: Insufficient documentation

## 2023-08-22 LAB — HM PAP SMEAR: HM Pap smear: NEGATIVE

## 2023-08-22 LAB — RESULTS CONSOLE HPV: CHL HPV: NEGATIVE

## 2023-12-28 ENCOUNTER — Encounter: Payer: Self-pay | Admitting: Internal Medicine

## 2023-12-28 ENCOUNTER — Ambulatory Visit (INDEPENDENT_AMBULATORY_CARE_PROVIDER_SITE_OTHER): Admitting: Internal Medicine

## 2023-12-28 VITALS — BP 122/74 | Ht 66.0 in | Wt 200.4 lb

## 2023-12-28 DIAGNOSIS — Z6832 Body mass index (BMI) 32.0-32.9, adult: Secondary | ICD-10-CM

## 2023-12-28 DIAGNOSIS — Z0001 Encounter for general adult medical examination with abnormal findings: Secondary | ICD-10-CM

## 2023-12-28 DIAGNOSIS — R739 Hyperglycemia, unspecified: Secondary | ICD-10-CM

## 2023-12-28 DIAGNOSIS — Z136 Encounter for screening for cardiovascular disorders: Secondary | ICD-10-CM

## 2023-12-28 DIAGNOSIS — E6609 Other obesity due to excess calories: Secondary | ICD-10-CM

## 2023-12-28 DIAGNOSIS — Z1159 Encounter for screening for other viral diseases: Secondary | ICD-10-CM | POA: Diagnosis not present

## 2023-12-28 DIAGNOSIS — E66811 Obesity, class 1: Secondary | ICD-10-CM

## 2023-12-28 NOTE — Progress Notes (Signed)
 Subjective:    Patient ID: Heidi Bonilla, female    DOB: 11/15/1985, 38 y.o.   MRN: [id number]  HPI  Patient presents to clinic today for her annual exam.  Flu: 12/2016 Tetanus: 11/2014 COVID: X 3 Pap smear: 08/2023 Mammogram: 07/2023 Vision screening: every 2 years Dentist: annually  Diet: She does eat meat. She consumes fruits and veggies. She does eat fried foods. She drinks coffee, water and soda. Exercise: None  Review of Systems   Past Medical History:  Diagnosis Date   Anemia    Anxiety    Complication of anesthesia 2016   last c-section was unable to recall anything during procedure   GERD (gastroesophageal reflux disease)    only during pregnancy    Current Outpatient Medications  Medication Sig Dispense Refill   escitalopram (LEXAPRO) 10 MG tablet Take 1 tablet by mouth daily.     norethindrone (MICRONOR) 0.35 MG tablet Take 1 tablet by mouth daily.     No current facility-administered medications for this visit.    No Known Allergies  Family History  Problem Relation Age of Onset   Cancer Father        prostate   Anxiety disorder Father     Social History   Socioeconomic History   Marital status: Married    Spouse name: Dorn   Number of children: 3   Years of education: Not on file   Highest education level: Master's degree (e.g., MA, MS, MEng, MEd, MSW, MBA)  Occupational History   Not on file  Tobacco Use   Smoking status: Never   Smokeless tobacco: Never  Vaping Use   Vaping status: Never Used  Substance and Sexual Activity   Alcohol use: Not Currently    Comment: occassional use prior to pregnancy   Drug use: No   Sexual activity: Yes  Other Topics Concern   Not on file  Social History Narrative   Not on file   Social Drivers of Health   Financial Resource Strain: Low Risk  (12/25/2023)   Overall Financial Resource Strain (CARDIA)    Difficulty of Paying Living Expenses: Not hard at all  Food Insecurity: No Food  Insecurity (12/25/2023)   Hunger Vital Sign    Worried About Running Out of Food in the Last Year: Never true    Ran Out of Food in the Last Year: Never true  Transportation Needs: No Transportation Needs (12/25/2023)   PRAPARE - Administrator, Civil Service (Medical): No    Lack of Transportation (Non-Medical): No  Physical Activity: Insufficiently Active (12/25/2023)   Exercise Vital Sign    Days of Exercise per Week: 2 days    Minutes of Exercise per Session: 30 min  Stress: No Stress Concern Present (12/25/2023)   [employer] of Occupational Health - Occupational Stress Questionnaire    Feeling of Stress: Not at all  Social Connections: Socially Integrated (12/25/2023)   Social Connection and Isolation Panel    Frequency of Communication with Friends and Family: Twice a week    Frequency of Social Gatherings with Friends and Family: Once a week    Attends Religious Services: More than 4 times per year    Active Member of [employer] or Organizations: Yes    Attends Engineer, structural: More than 4 times per year    Marital Status: Married  Catering manager Violence: Not on file     Constitutional: Denies fever, malaise, fatigue, headache or abrupt weight changes.  HEENT: Denies eye pain, eye redness, ear pain, ringing in the ears, wax buildup, runny nose, nasal congestion, bloody nose, or sore throat. Respiratory: Denies difficulty breathing, shortness of breath, cough or sputum production.   Cardiovascular: Denies chest pain, chest tightness, palpitations or swelling in the hands or feet.  Gastrointestinal: Denies abdominal pain, bloating, constipation, diarrhea or blood in the stool.  GU: Denies urgency, frequency, pain with urination, burning sensation, blood in urine, odor or discharge. Musculoskeletal: Denies decrease in range of motion, difficulty with gait, muscle pain or joint pain and swelling.  Skin: Denies redness, rashes, lesions or  ulcercations.  Neurological: Denies dizziness, difficulty with memory, difficulty with speech or problems with balance and coordination.  Psych: Patient has a history of anxiety.  Denies depression, SI/HI.  No other specific complaints in a complete review of systems (except as listed in HPI above).      Objective:   Physical Exam  BP 122/74 (BP Location: Right Arm, Patient Position: Sitting, Cuff Size: Normal)   Ht 5' 6 (1.676 m)   Wt 200 lb 6.4 oz (90.9 kg)   LMP 12/21/2023 (Exact Date)   BMI 32.35 kg/m   Wt Readings from Last 3 Encounters:  06/20/23 199 lb (90.3 kg)  12/24/21 190 lb (86.2 kg)  04/28/20 205 lb (93 kg)    General: Appears her stated age, obese, in NAD. Skin: Warm, dry and intact. No rashes, lesions or ulcerations noted. HEENT: Head: normal shape and size; Eyes: sclera white, no icterus, conjunctiva pink, PERRLA and EOMs intact;  Neck:  Neck supple, trachea midline. No masses, lumps or thyromegaly present.  Cardiovascular: Normal rate and rhythm. S1,S2 noted.  No murmur, rubs or gallops noted. No JVD or BLE edema. No carotid bruits noted. Pulmonary/Chest: Normal effort and positive vesicular breath sounds. No respiratory distress. No wheezes, rales or ronchi noted.  Abdomen: Normal bowel sounds.  Musculoskeletal: Strength 5/5 BUE/BLE.  No difficulty with gait.  Neurological: Alert and oriented. Cranial nerves II-XII grossly intact. Coordination normal.  Psychiatric: Mood and affect normal. Behavior is normal. Judgment and thought content normal.    BMET    Component Value Date/Time   NA 136 04/27/2020 0914   K 3.7 04/27/2020 0914   CL 105 04/27/2020 0914   CO2 22 04/27/2020 0914   GLUCOSE 100 (H) 04/27/2020 0914   BUN 7 04/27/2020 0914   CREATININE 0.54 04/27/2020 0914   CREATININE 0.86 11/13/2015 1556   CALCIUM 8.9 04/27/2020 0914   GFRNONAA >60 04/27/2020 0914   GFRNONAA >89 11/13/2015 1556   GFRAA >89 11/13/2015 1556    Lipid Panel      Component Value Date/Time   CHOL 163 11/13/2015 1556   TRIG 68 11/13/2015 1556   HDL 73 11/13/2015 1556   CHOLHDL 2.2 11/13/2015 1556   VLDL 14 11/13/2015 1556   LDLCALC 76 11/13/2015 1556    CBC    Component Value Date/Time   WBC 10.1 04/29/2020 0424   RBC 3.10 (L) 04/29/2020 0424   HGB 9.2 (L) 04/29/2020 0424   HGB 12.1 12/08/2011 2146   HCT 27.1 (L) 04/29/2020 0424   HCT 33.0 (L) 12/10/2011 0510   PLT 111 (L) 04/29/2020 0424   PLT 139 (L) 12/09/2011 1238   MCV 87.4 04/29/2020 0424   MCV 87 12/08/2011 2146   MCH 29.7 04/29/2020 0424   MCHC 33.9 04/29/2020 0424   RDW 12.8 04/29/2020 0424   RDW 12.8 12/08/2011 2146   LYMPHSABS 2,016 11/13/2015 1556  LYMPHSABS 2.0 12/08/2011 2146   MONOABS 392 11/13/2015 1556   MONOABS 0.6 12/08/2011 2146   EOSABS 56 11/13/2015 1556   EOSABS 0.1 12/08/2011 2146   BASOSABS 56 11/13/2015 1556   BASOSABS 0.1 12/08/2011 2146    Hgb A1C No results found for: HGBA1C          Assessment & Plan:    Preventative health maintenance:  Flu shot declined Tetanus UTD Encouraged her to get her COVID booster Pap smear UTD Mammogram UTD Encouraged her to consume a balanced diet and exercise regimen Advised her to see an eye doctor and dentist annually We will check CBC, c-Met, lipid, A1c and hep C today  RTC in 6 months, follow-up chronic conditions Angeline Laura, NP'

## 2023-12-28 NOTE — Assessment & Plan Note (Signed)
 Encouraged diet and exercise for weight loss ?

## 2023-12-28 NOTE — Patient Instructions (Signed)

## 2023-12-29 ENCOUNTER — Ambulatory Visit: Payer: Self-pay | Admitting: Internal Medicine

## 2023-12-29 LAB — LIPID PANEL
Cholesterol: 197 mg/dL (ref <200)
HDL: 56 mg/dL (ref >=50)
LDL Cholesterol (Calc): 120 mg/dL — ABNORMAL HIGH
Non-HDL Cholesterol (Calc): 141 mg/dL — ABNORMAL HIGH (ref <130)
Total CHOL/HDL Ratio: 3.5 (calc) (ref <5.0)
Triglycerides: 99 mg/dL (ref <150)

## 2023-12-29 LAB — COMPREHENSIVE METABOLIC PANEL WITH GFR
AG Ratio: 2.3 (calc) (ref 1.0–2.5)
ALT: 13 U/L (ref 6–29)
AST: 14 U/L (ref 10–30)
Albumin: 4.5 g/dL (ref 3.6–5.1)
Alkaline phosphatase (APISO): 23 U/L — ABNORMAL LOW (ref 31–125)
BUN: 13 mg/dL (ref 7–25)
CO2: 24 mmol/L (ref 20–32)
Calcium: 9.4 mg/dL (ref 8.6–10.2)
Chloride: 105 mmol/L (ref 98–110)
Creat: 0.85 mg/dL (ref 0.50–0.99)
Globulin: 2 g/dL (ref 1.9–3.7)
Glucose, Bld: 77 mg/dL (ref 65–139)
Potassium: 4.4 mmol/L (ref 3.5–5.3)
Sodium: 139 mmol/L (ref 135–146)
Total Bilirubin: 0.5 mg/dL (ref 0.2–1.2)
Total Protein: 6.5 g/dL (ref 6.1–8.1)
eGFR: 89 mL/min/1.73m2 (ref >=60)

## 2023-12-29 LAB — HEPATITIS C ANTIBODY: Hepatitis C Ab: NONREACTIVE

## 2023-12-29 LAB — CBC
HCT: 38.8 % (ref 35.0–45.0)
Hemoglobin: 13 g/dL (ref 11.7–15.5)
MCH: 29 pg (ref 27.0–33.0)
MCHC: 33.5 g/dL (ref 32.0–36.0)
MCV: 86.4 fL (ref 80.0–100.0)
MPV: 11 fL (ref 7.5–12.5)
Platelets: 268 Thousand/uL (ref 140–400)
RBC: 4.49 Million/uL (ref 3.80–5.10)
RDW: 12.2 % (ref 11.0–15.0)
WBC: 4.2 Thousand/uL (ref 3.8–10.8)

## 2023-12-29 LAB — HEMOGLOBIN A1C
Hgb A1c MFr Bld: 5.5 % (ref <5.7)
Mean Plasma Glucose: 111 mg/dL
eAG (mmol/L): 6.2 mmol/L

## 2024-02-12 ENCOUNTER — Telehealth: Payer: Self-pay

## 2024-02-12 NOTE — Telephone Encounter (Signed)
 Copied from CRM [id number]. Topic: General - Other >> Feb 12, 2024 11:57 AM Emylou G wrote: Reason for CRM: Patient called.. returning ambers call about her paperwork?  Pls give her call back on status

## 2024-02-12 NOTE — Telephone Encounter (Signed)
 Spoke with patient and got the information needed to complete the form dropped off. Ready for pickup. Patient advised.

## 2024-06-27 ENCOUNTER — Ambulatory Visit: Admitting: Internal Medicine
